# Patient Record
Sex: Male | Born: 1980 | Race: Black or African American | Hispanic: No | Marital: Married | Smoking: Former smoker
Health system: Southern US, Community
[De-identification: ages and names within clinical notes are randomized; demographics above are authoritative.]

## PROBLEM LIST (undated history)

## (undated) HISTORY — PX: BICEPS TENDON REPAIR: SHX566

## (undated) HISTORY — PX: KNEE SURGERY: SHX244

---

## 2015-01-07 DIAGNOSIS — S46191D Other injury of muscle, fascia and tendon of long head of biceps, right arm, subsequent encounter: Secondary | ICD-10-CM | POA: Insufficient documentation

## 2016-03-03 DIAGNOSIS — R739 Hyperglycemia, unspecified: Secondary | ICD-10-CM | POA: Insufficient documentation

## 2016-03-03 DIAGNOSIS — E66813 Obesity, class 3: Secondary | ICD-10-CM | POA: Insufficient documentation

## 2016-03-03 DIAGNOSIS — R61 Generalized hyperhidrosis: Secondary | ICD-10-CM | POA: Insufficient documentation

## 2016-03-03 DIAGNOSIS — N5089 Other specified disorders of the male genital organs: Secondary | ICD-10-CM | POA: Insufficient documentation

## 2018-06-18 ENCOUNTER — Encounter (HOSPITAL_COMMUNITY): Payer: Self-pay

## 2018-06-18 ENCOUNTER — Ambulatory Visit (HOSPITAL_COMMUNITY)
Admission: EM | Admit: 2018-06-18 | Discharge: 2018-06-18 | Disposition: A | Discharge status: Home / Self Care | Payer: Self-pay | Attending: Urgent Care | Admitting: Urgent Care

## 2018-06-18 ENCOUNTER — Other Ambulatory Visit: Payer: Self-pay

## 2018-06-18 DIAGNOSIS — R070 Pain in throat: Secondary | ICD-10-CM | POA: Insufficient documentation

## 2018-06-18 DIAGNOSIS — R05 Cough: Secondary | ICD-10-CM | POA: Insufficient documentation

## 2018-06-18 DIAGNOSIS — R69 Illness, unspecified: Secondary | ICD-10-CM | POA: Insufficient documentation

## 2018-06-18 DIAGNOSIS — R059 Cough, unspecified: Secondary | ICD-10-CM

## 2018-06-18 DIAGNOSIS — J111 Influenza due to unidentified influenza virus with other respiratory manifestations: Secondary | ICD-10-CM

## 2018-06-18 MED ORDER — OSELTAMIVIR PHOSPHATE 75 MG PO CAPS: 75.0000 mg | ORAL_CAPSULE | Freq: Two times a day (BID) | ORAL | 0 refills | Status: DC

## 2018-06-18 MED ORDER — BENZONATATE 100 MG PO CAPS: 100.0000 mg | ORAL_CAPSULE | Freq: Three times a day (TID) | ORAL | 0 refills | Status: DC | PRN

## 2018-06-18 NOTE — Discharge Instructions (Signed)
For sore throat try using a honey-based tea. Use 3 teaspoons of honey with juice squeezed from half lemon. Place shaved pieces of ginger into 1/2-1 cup of water and warm over stove top. Then mix the ingredients and repeat every 4 hours as needed. You may take 500mg Tylenol with ibuprofen 400-600mg every 6 hours for pain and inflammation.  °

## 2018-06-18 NOTE — ED Triage Notes (Signed)
Pt cc sore throat x 3 days body aches and chills  started today

## 2018-06-18 NOTE — ED Provider Notes (Addendum)
  MRN: [id number] DOB: 03/24/1981  Subjective:   Adam Rasmussen is a 37 y.o. male presenting for 3 day history of moderate-severe sharp constant throat pain, some difficulty swallowing. Has also had mild cough, started having body aches, chills today. He has a 729 month old son that also had similar symptoms a few days ago. He is not currently taking any medications and has no known food or drug allergies.  Denies past medical and surgical history. Patient is a former smoker.   Objective:   Vitals: BP 132/72 (BP Location: Right Arm)   Pulse 63   Temp 98.1 F (36.7 C)   Resp 18   Wt (!) 320 lb (145.2 kg)   SpO2 100%   Physical Exam Constitutional:      General: He is not in acute distress.    Appearance: He is well-developed. He is obese. He is not ill-appearing, toxic-appearing or diaphoretic.  HENT:     Head: Normocephalic and atraumatic.     Right Ear: Tympanic membrane and ear canal normal. No drainage, swelling or tenderness. No middle ear effusion. Tympanic membrane is not erythematous.     Left Ear: Tympanic membrane and ear canal normal. No drainage, swelling or tenderness.  No middle ear effusion. Tympanic membrane is not erythematous.     Nose: No congestion or rhinorrhea.     Mouth/Throat:     Mouth: Mucous membranes are moist.     Pharynx: Oropharynx is clear. No oropharyngeal exudate, posterior oropharyngeal erythema or uvula swelling.     Tonsils: No tonsillar exudate or tonsillar abscesses.  Eyes:     Extraocular Movements:     Right eye: Normal extraocular motion.     Left eye: Normal extraocular motion.     Conjunctiva/sclera: Conjunctivae normal.     Pupils: Pupils are equal, round, and reactive to light.  Neck:     Musculoskeletal: Normal range of motion and neck supple.  Cardiovascular:     Rate and Rhythm: Normal rate and regular rhythm.     Heart sounds: No murmur. No friction rub. No gallop.   Pulmonary:     Effort: Pulmonary effort is normal. No respiratory  distress.     Breath sounds: Normal breath sounds. No stridor. No wheezing, rhonchi or rales.  Chest:     Chest wall: No tenderness.  Lymphadenopathy:     Cervical: No cervical adenopathy.  Neurological:     Mental Status: He is alert and oriented to person, place, and time.  Psychiatric:        Mood and Affect: Mood normal.        Behavior: Behavior normal.     Assessment and Plan :   Influenza-like illness  Throat pain  Cough  Will manage empirically for influenza with Tamiflu, has reassuring physical exam findings. Advised supportive care, offered symptomatic relief. Return-to-clinic precautions discussed, patient verbalized understanding.       Wallis BambergMani, Kattie Santoyo, PA-C 06/18/18 [id number]    Wallis BambergMani, Krishawna Stiefel, PA-C 06/18/18 [phone]

## 2018-08-20 ENCOUNTER — Other Ambulatory Visit: Payer: Self-pay | Admitting: Nurse Practitioner

## 2018-08-20 DIAGNOSIS — S99911D Unspecified injury of right ankle, subsequent encounter: Secondary | ICD-10-CM

## 2018-08-20 DIAGNOSIS — M79661 Pain in right lower leg: Secondary | ICD-10-CM

## 2018-08-23 DIAGNOSIS — Z6841 Body Mass Index (BMI) 40.0 and over, adult: Secondary | ICD-10-CM | POA: Insufficient documentation

## 2018-08-31 ENCOUNTER — Ambulatory Visit
Admission: RE | Admit: 2018-08-31 | Discharge: 2018-08-31 | Disposition: A | Discharge status: Home / Self Care | Payer: Self-pay | Source: Ambulatory Visit | Attending: Nurse Practitioner | Admitting: Nurse Practitioner

## 2018-08-31 ENCOUNTER — Ambulatory Visit
Admission: RE | Admit: 2018-08-31 | Discharge: 2018-08-31 | Disposition: A | Discharge status: Home / Self Care | Payer: Worker's Compensation | Source: Ambulatory Visit | Attending: Nurse Practitioner | Admitting: Nurse Practitioner

## 2018-08-31 DIAGNOSIS — M79661 Pain in right lower leg: Secondary | ICD-10-CM

## 2018-08-31 DIAGNOSIS — S99911D Unspecified injury of right ankle, subsequent encounter: Secondary | ICD-10-CM

## 2019-01-04 ENCOUNTER — Other Ambulatory Visit: Payer: Self-pay

## 2019-01-04 DIAGNOSIS — Z20822 Contact with and (suspected) exposure to covid-19: Secondary | ICD-10-CM

## 2019-01-10 LAB — NOVEL CORONAVIRUS, NAA: SARS-CoV-2, NAA: NOT DETECTED

## 2019-08-03 IMAGING — US RIGHT LOWER EXTREMITY VENOUS ULTRASOUND
1 series · 13 of 24 positions shown · non-contrast
Comparison: None.

CLINICAL DATA: 37-year-old male with right lower extremity pain and
swelling. Recent ankle injury.



[Series 1: right lower extremity venous ultrasound · 0.10mm/px · 13 of 32 slices shown]
[im 1/32]
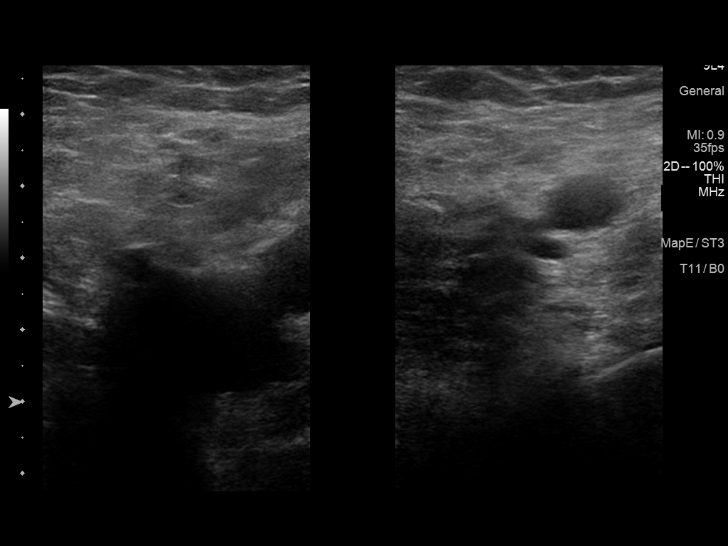
[im 3/32]
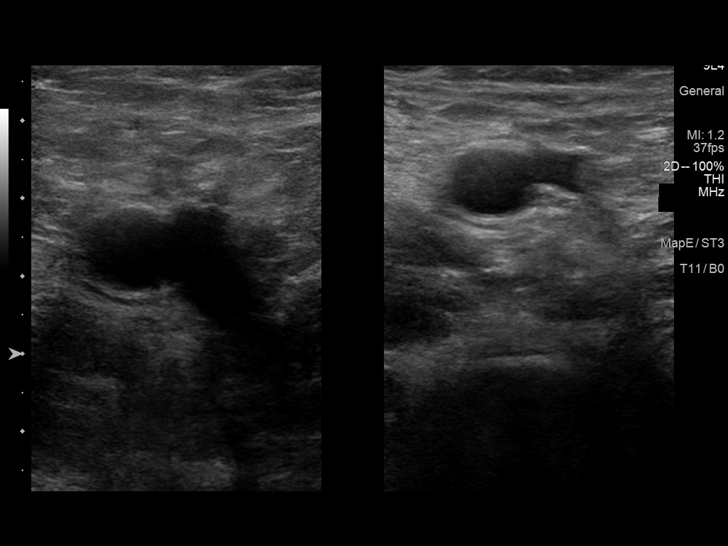
[im 6/32]
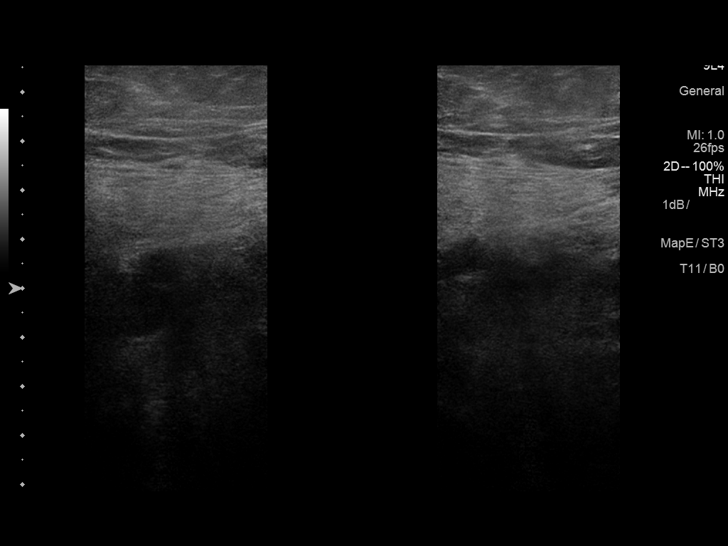
[im 9/32]
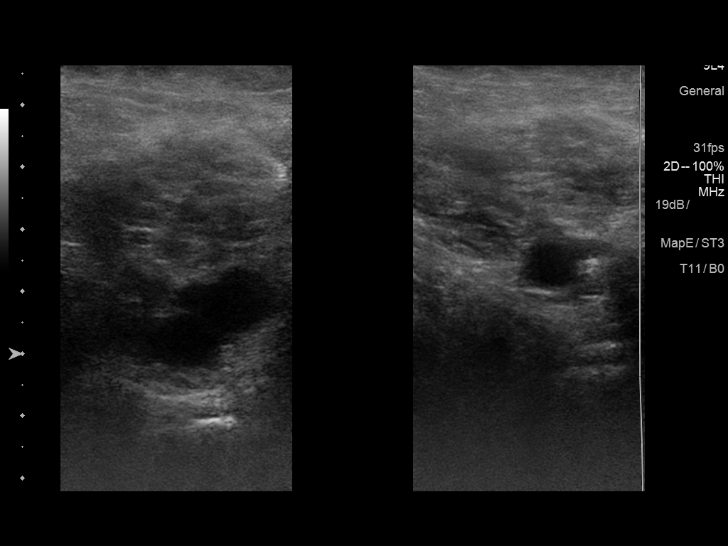
[im 11/32]
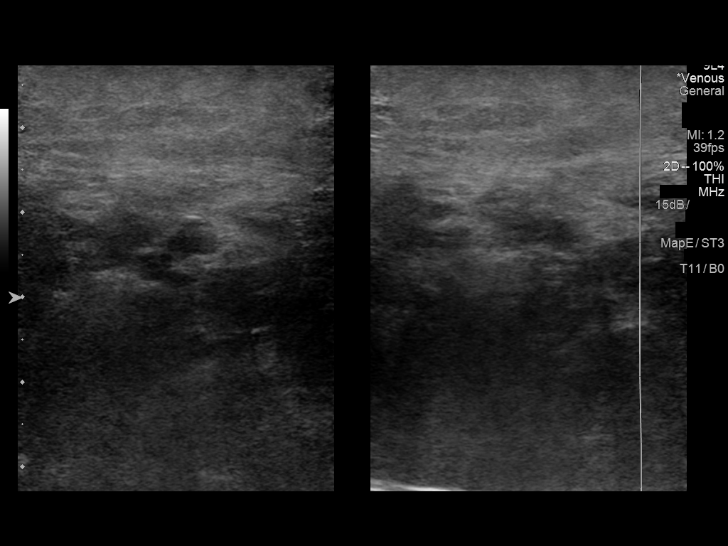
[im 14/32]
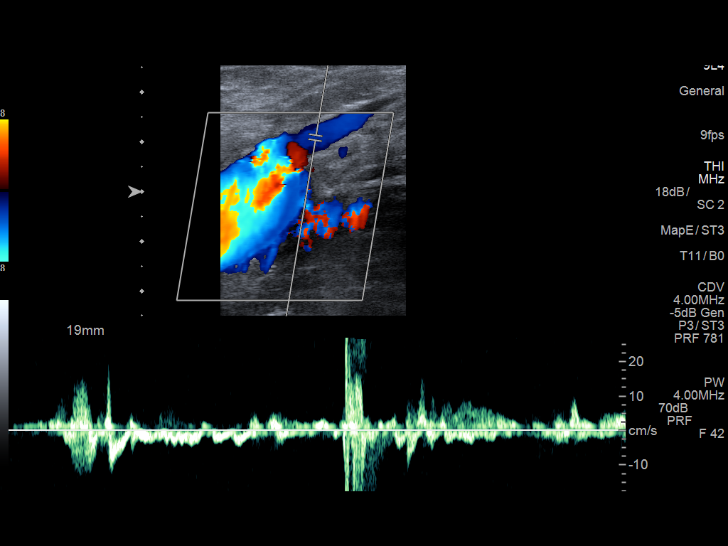
[im 17/32]
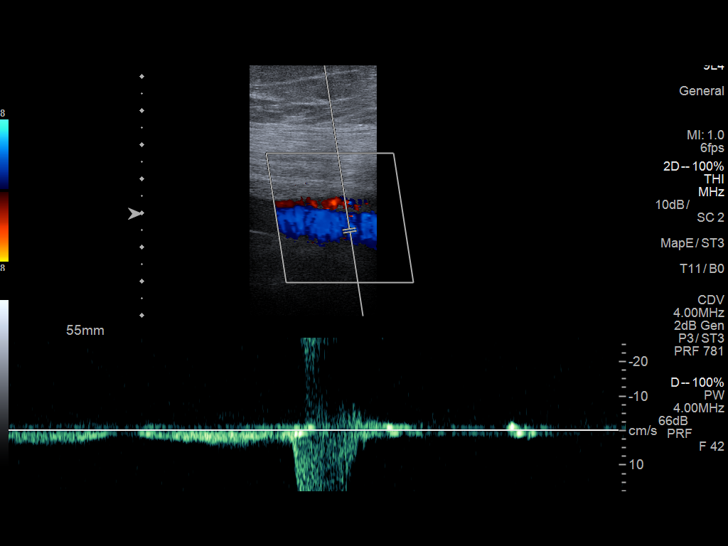
[im 18/32]
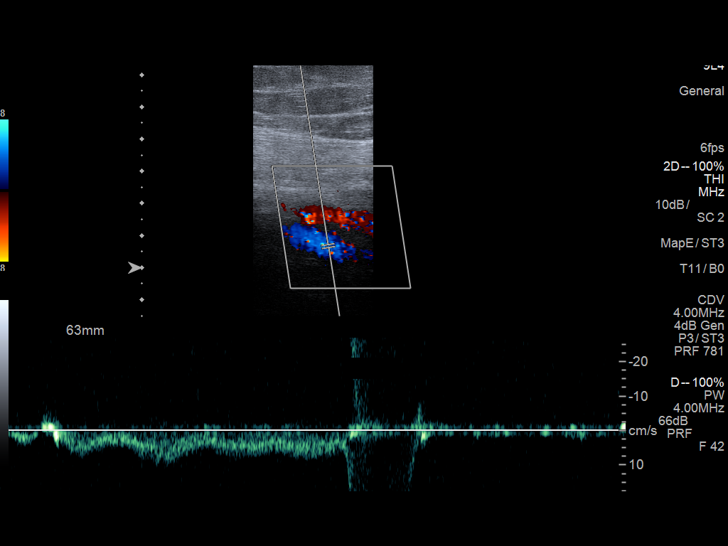
[im 21/32]
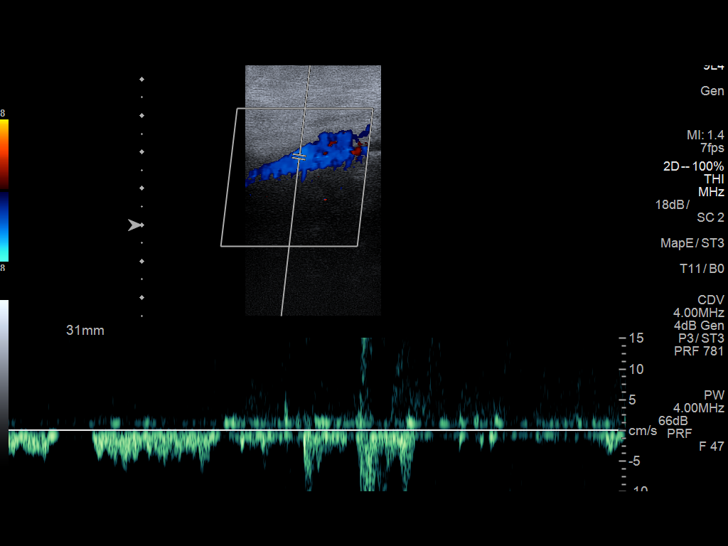
[im 23/32]
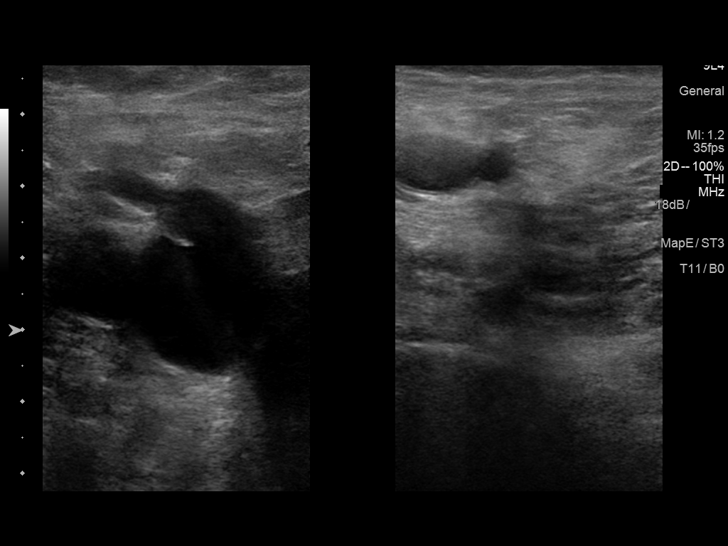
[im 26/32]
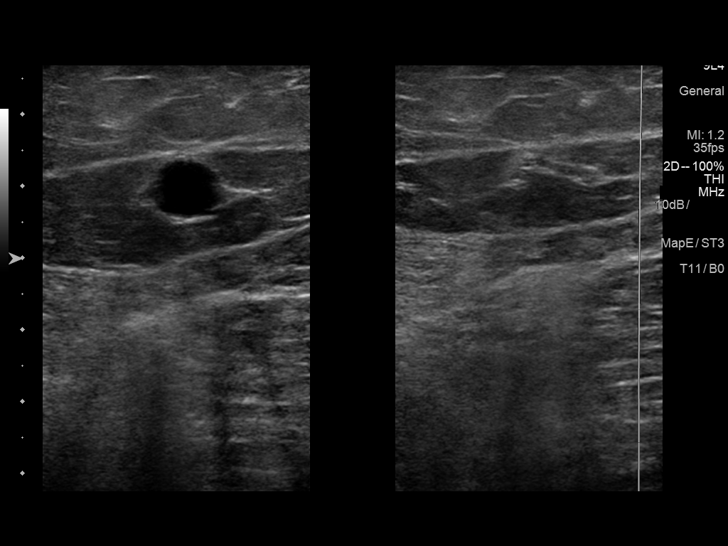
[im 29/32]
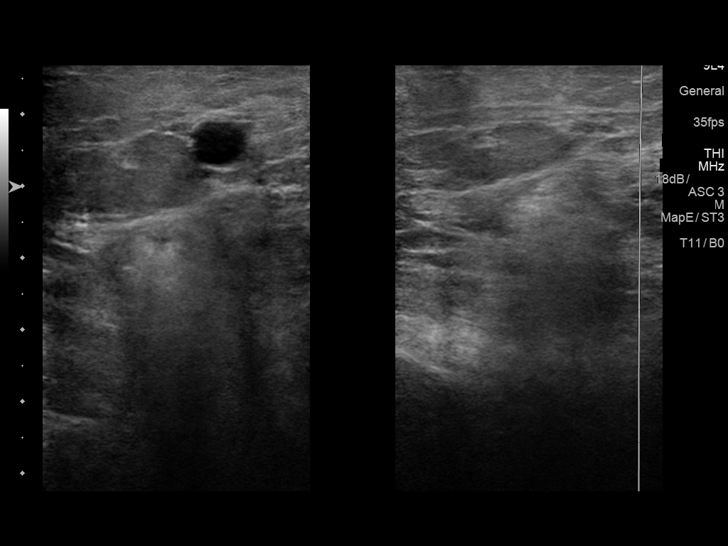
[im 32/32]
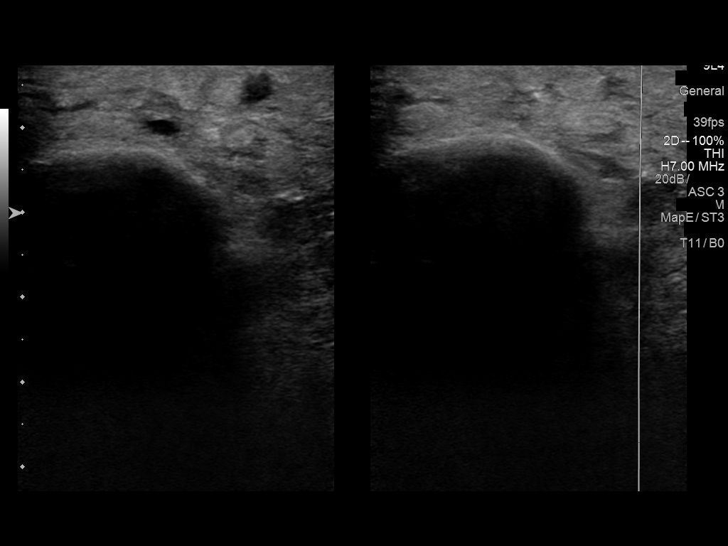

[13 of 24 positions shown; findings below may reference images not displayed]

FINDINGS: Contralateral Common Femoral Vein: Respiratory phasicity is normal
and symmetric with the symptomatic side. No evidence of thrombus.
Normal compressibility.

Common Femoral Vein: No evidence of thrombus. Normal
compressibility, respiratory phasicity and response to augmentation.

Saphenofemoral Junction: No evidence of thrombus. Normal
compressibility and flow on color Doppler imaging.

Profunda Femoral Vein: No evidence of thrombus. Normal
compressibility and flow on color Doppler imaging.

Femoral Vein: No evidence of thrombus. Normal compressibility,
respiratory phasicity and response to augmentation.

Popliteal Vein: No evidence of thrombus. Normal compressibility,
respiratory phasicity and response to augmentation.

Calf Veins: No evidence of thrombus. Normal compressibility and flow
on color Doppler imaging.

Superficial Great Saphenous Vein: No evidence of thrombus. Normal
compressibility.

Venous Reflux:  None.

Other Findings:  None.
IMPRESSION: No evidence of deep venous thrombosis.

## 2019-12-10 ENCOUNTER — Encounter: Payer: Self-pay | Admitting: Family Medicine

## 2019-12-10 ENCOUNTER — Other Ambulatory Visit: Payer: Self-pay

## 2019-12-10 ENCOUNTER — Ambulatory Visit: Payer: Self-pay | Admitting: Family Medicine

## 2019-12-10 VITALS — BP 115/74 | HR 70 | Ht 72.0 in | Wt 319.0 lb

## 2019-12-10 DIAGNOSIS — Z024 Encounter for examination for driving license: Secondary | ICD-10-CM | POA: Insufficient documentation

## 2019-12-10 LAB — POCT URINALYSIS DIPSTICK
Bilirubin, UA: NEGATIVE
Blood, UA: NEGATIVE
Glucose, UA: NEGATIVE
Ketones, UA: NEGATIVE
Leukocytes, UA: NEGATIVE
Nitrite, UA: NEGATIVE
Protein, UA: NEGATIVE
Spec Grav, UA: 1.015 (ref 1.010–1.025)
Urobilinogen, UA: 0.2 E.U./dL
pH, UA: 5 (ref 5.0–8.0)

## 2019-12-10 NOTE — Assessment & Plan Note (Signed)
DOT PE - certificate provided x 2 years

## 2019-12-10 NOTE — Patient Instructions (Signed)
DOT PE - certificate issued for 2 years

## 2019-12-10 NOTE — Progress Notes (Signed)
Subjective:    Patient ID: Adam Rasmussen, male    DOB: 1981-05-28, 39 y.o.   MRN: [id number]  Adam Rasmussen is a 39 y.o. 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  Social History   Tobacco Use  . Smoking status: Current Every Day Smoker    Packs/day: 0.50    Types: Cigarettes  . Smokeless tobacco: Never Used  Substance Use Topics  . Alcohol use: Yes    Comment: occasionally  . Drug use: Never    Review of Systems  Constitutional: Negative.   HENT: Negative.   Eyes: Negative.   Respiratory: Negative.   Cardiovascular: Negative.   Gastrointestinal: Negative.   Endocrine: Negative.   Genitourinary: Negative.   Musculoskeletal: Negative.   Skin: Negative.   Allergic/Immunologic: Negative.   Neurological: Negative.   Hematological: Negative.   Psychiatric/Behavioral: Negative.    Per HPI unless specifically indicated above     Objective:    BP 115/74 (BP Location: Left Arm, Patient Position: Sitting, Cuff Size: Large)   Pulse 70   Ht 6' (1.829 m)   Wt (!) 319 lb (144.7 kg)   BMI 43.26 kg/m   Wt Readings from Last 3 Encounters:  12/10/19 (!) 319 lb (144.7 kg)  06/18/18 (!) 320 lb (145.2 kg)    Physical Exam Vitals reviewed.  Constitutional:      General: He is not in acute distress.    Appearance: Normal appearance. He is well-developed and well-groomed. He is obese. He is not ill-appearing or toxic-appearing.  HENT:     Head: Normocephalic.     Right Ear: Tympanic membrane, ear canal and external ear normal. There is no impacted cerumen.     Left Ear: Tympanic membrane, ear canal and external ear normal. There is no impacted cerumen.     Nose: Nose normal. No congestion or rhinorrhea.     Mouth/Throat:     Mouth: Mucous membranes are moist.     Pharynx: Oropharynx is clear. No oropharyngeal exudate or posterior oropharyngeal erythema.  Eyes:     General: Lids are  normal. Vision grossly intact. No scleral icterus.       Right eye: No discharge.        Left eye: No discharge.     Extraocular Movements: Extraocular movements intact.     Conjunctiva/sclera: Conjunctivae normal.     Pupils: Pupils are equal, round, and reactive to light.  Cardiovascular:     Rate and Rhythm: Normal rate and regular rhythm.     Pulses: Normal pulses.          Dorsalis pedis pulses are 2+ on the right side and 2+ on the left side.     Heart sounds: Normal heart sounds. No murmur heard.  No friction rub. No gallop.   Pulmonary:     Effort: Pulmonary effort is normal. No respiratory distress.     Breath sounds: Normal breath sounds. No wheezing, rhonchi or rales.  Abdominal:     General: Abdomen is flat. Bowel sounds are normal. There is no distension.     Palpations: Abdomen is soft. There is no hepatomegaly, splenomegaly or mass.     Tenderness: There is no abdominal tenderness. There is no right CVA tenderness, left CVA tenderness, guarding or rebound.     Hernia: No hernia is present.  Musculoskeletal:        General: Normal range of  motion.     Cervical back: Normal range of motion and neck supple. No rigidity or tenderness.     Right lower leg: No edema.     Left lower leg: No edema.     Comments: Normal tone 5/5 strength BUE & BLE  Feet:     Right foot:     Skin integrity: Skin integrity normal.     Left foot:     Skin integrity: Skin integrity normal.  Lymphadenopathy:     Cervical: No cervical adenopathy.  Skin:    General: Skin is warm and dry.     Capillary Refill: Capillary refill takes less than 2 seconds.  Neurological:     General: No focal deficit present.     Mental Status: He is alert and oriented to person, place, and time.     Cranial Nerves: Cranial nerves are intact. No cranial nerve deficit.     Sensory: Sensation is intact. No sensory deficit.     Motor: Motor function is intact. No weakness.     Coordination: Coordination is intact.  Coordination normal.     Gait: Gait is intact. Gait normal.     Deep Tendon Reflexes: Reflexes are normal and symmetric. Reflexes normal.  Psychiatric:        Attention and Perception: Attention and perception normal.        Mood and Affect: Mood and affect normal.        Speech: Speech normal.        Behavior: Behavior normal. Behavior is cooperative.        Thought Content: Thought content normal.        Cognition and Memory: Cognition and memory normal.        Judgment: Judgment normal.    Results for orders placed or performed in visit on 12/10/19  POCT Urinalysis Dipstick  Result Value Ref Range   Color, UA Yellow    Clarity, UA clear    Glucose, UA Negative Negative   Bilirubin, UA negative    Ketones, UA negative    Spec Grav, UA 1.015 1.010 - 1.025   Blood, UA negative    pH, UA 5.0 5.0 - 8.0   Protein, UA Negative Negative   Urobilinogen, UA 0.2 0.2 or 1.0 E.U./dL   Nitrite, UA negative    Leukocytes, UA Negative Negative   Appearance     Odor        Assessment & Plan:   Problem List Items Addressed This Visit      Other   Encounter for commercial driving license (CDL) exam    DOT PE - certificate provided x 2 years       Other Visit Diagnoses    Encounter for CDL (commercial driving license) exam    -  Primary   Relevant Orders   POCT Urinalysis Dipstick (Completed)      No orders of the defined types were placed in this encounter.     Follow up plan: Return in about 2 years (around 12/09/2021) for DOT PE.   Charlaine Dalton, FNP Family Nurse Practitioner [facility] 12/10/2019, 11:45 AM

## 2020-07-29 DIAGNOSIS — R7303 Prediabetes: Secondary | ICD-10-CM | POA: Insufficient documentation

## 2021-03-10 DIAGNOSIS — M545 Low back pain, unspecified: Secondary | ICD-10-CM | POA: Diagnosis not present

## 2021-08-06 DIAGNOSIS — L814 Other melanin hyperpigmentation: Secondary | ICD-10-CM | POA: Diagnosis not present

## 2021-08-06 DIAGNOSIS — M79604 Pain in right leg: Secondary | ICD-10-CM | POA: Diagnosis not present

## 2021-08-06 DIAGNOSIS — Z8042 Family history of malignant neoplasm of prostate: Secondary | ICD-10-CM | POA: Diagnosis not present

## 2021-08-06 DIAGNOSIS — R7303 Prediabetes: Secondary | ICD-10-CM | POA: Diagnosis not present

## 2021-08-06 DIAGNOSIS — Z720 Tobacco use: Secondary | ICD-10-CM | POA: Diagnosis not present

## 2021-08-06 DIAGNOSIS — Z0000 Encounter for general adult medical examination without abnormal findings: Secondary | ICD-10-CM | POA: Diagnosis not present

## 2021-10-21 DIAGNOSIS — Z720 Tobacco use: Secondary | ICD-10-CM | POA: Diagnosis not present

## 2021-10-21 DIAGNOSIS — F331 Major depressive disorder, recurrent, moderate: Secondary | ICD-10-CM | POA: Diagnosis not present

## 2021-10-21 DIAGNOSIS — G4733 Obstructive sleep apnea (adult) (pediatric): Secondary | ICD-10-CM | POA: Diagnosis not present

## 2021-10-21 DIAGNOSIS — Z6841 Body Mass Index (BMI) 40.0 and over, adult: Secondary | ICD-10-CM | POA: Diagnosis not present

## 2021-10-21 DIAGNOSIS — R0982 Postnasal drip: Secondary | ICD-10-CM | POA: Diagnosis not present

## 2021-10-21 DIAGNOSIS — R1030 Lower abdominal pain, unspecified: Secondary | ICD-10-CM | POA: Diagnosis not present

## 2021-10-21 DIAGNOSIS — R369 Urethral discharge, unspecified: Secondary | ICD-10-CM | POA: Diagnosis not present

## 2021-10-30 DIAGNOSIS — R1030 Lower abdominal pain, unspecified: Secondary | ICD-10-CM | POA: Diagnosis not present

## 2021-10-30 DIAGNOSIS — R369 Urethral discharge, unspecified: Secondary | ICD-10-CM | POA: Diagnosis not present

## 2021-11-12 ENCOUNTER — Ambulatory Visit (INDEPENDENT_AMBULATORY_CARE_PROVIDER_SITE_OTHER): Payer: BC Managed Care – PPO | Admitting: Adult Health

## 2021-11-12 ENCOUNTER — Encounter: Payer: Self-pay | Admitting: Adult Health

## 2021-11-12 VITALS — BP 126/80 | HR 72 | Temp 98.0°F | Ht 72.0 in | Wt 336.8 lb

## 2021-11-12 DIAGNOSIS — Z024 Encounter for examination for driving license: Secondary | ICD-10-CM | POA: Diagnosis not present

## 2021-11-12 DIAGNOSIS — G4733 Obstructive sleep apnea (adult) (pediatric): Secondary | ICD-10-CM | POA: Diagnosis not present

## 2021-11-12 NOTE — Assessment & Plan Note (Signed)
History of obstructive sleep apnea, morbid obesity with BMI at 45, snoring and restless sleep all suspicious that patient has ongoing sleep apnea.  We will set patient up for home sleep study for further evaluation  - discussed how weight can impact sleep and risk for sleep disordered breathing - discussed options to assist with weight loss: combination of diet modification, cardiovascular and strength training exercises   - had an extensive discussion regarding the adverse health consequences related to untreated sleep disordered breathing - specifically discussed the risks for hypertension, coronary artery disease, cardiac dysrhythmias, cerebrovascular disease, and diabetes - lifestyle modification discussed   - discussed how sleep disruption can increase risk of accidents, particularly when driving - safe driving practices were discussed   Plan  Patient Instructions  Set up for home sleep study Work on healthy weight loss Do not drive if sleepy Follow-up in 6 to 8 weeks to review results and treatment plan

## 2021-11-12 NOTE — Patient Instructions (Signed)
Set up for home sleep study Work on healthy weight loss Do not drive if sleepy Follow-up in 6 to 8 weeks to review results and treatment plan

## 2021-11-12 NOTE — Progress Notes (Signed)
@Patient  ID: Adam Rasmussen, male    DOB: 1981/02/19, 41 y.o.   MRN: [id number]  Chief Complaint  Patient presents with   Consult    Referring provider: Elinor Parkinson  HPI: 41 year old male seen for sleep consult Nov 12, 2021 for sleep apnea.  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  TEST/EVENTS :   11/12/2021 Sleep consult  Patient presents for sleep consult today.  Patient says he was diagnosed with sleep apnea in 2017.  He underwent a sleep study that showed sleep apnea.  He is unsure how severe it was.  He was recommended to either do weight loss or CPAP.  Patient says he never started CPAP tried to lose some weight but has been unsuccessful.  Current weight is at 336 pounds with a BMI of 45.  Patient says he recently had a DOT physical.  He was referred over to for evaluation of sleep apnea.  Patient says he does feel sleepy during the daytime.  Have some restless sleep and intermittent snoring.  Typically goes to bed about 8:30 PM.  Takes very short time to go to sleep.  Is only up once or twice at nighttime.  Gets up at 4:30 AM.  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   Epworth score is 13 out of 24.  Gets sleepy if he is inactive or sitting still or in the afternoon hours. Caffeine intake 4 sodas daily . Caffeine gummies on occasion.  No symptoms suspicious for sleep paralysis or cataplexy No history of stroke or congestive heart failure  Medical history is unremarkable  Surgical history none  Social history patient is Married , 2 kids .  Smokes half a pack of cigarettes daily, no alcohol  Family history- OSA   No Known Allergies  Immunization History  Administered Date(s) Administered   Moderna Sars-Covid-2 Vaccination 04/25/2020, 05/23/2020   Tdap 12/13/2012    No past medical history on file.  Tobacco History: Social History   Tobacco Use  Smoking Status Every Day   Packs/day: 0.50   Types: Cigarettes   Smokeless Tobacco Never  Tobacco Comments   Has smoked since he was 41 years old.   Ready to quit: Not Answered Counseling given: Not Answered Tobacco comments: Has smoked since he was 41 years old.   Outpatient Medications Prior to Visit  Medication Sig Dispense Refill   buPROPion (WELLBUTRIN XL) 150 MG 24 hr tablet Take 300 mg by mouth every morning. (Patient not taking: Reported on 11/12/2021)     cetirizine (ZYRTEC) 10 MG tablet Take 10 mg by mouth daily. (Patient not taking: Reported on 11/12/2021)     No facility-administered medications prior to visit.     Review of Systems:   Constitutional:   No  weight loss, night sweats,  Fevers, chills, fatigue, or  lassitude.  HEENT:   No headaches,  Difficulty swallowing,  Tooth/dental problems, or  Sore throat,                No sneezing, itching, ear ache, nasal congestion, post nasal drip,   CV:  No chest pain,  Orthopnea, PND, swelling in lower extremities, anasarca, dizziness, palpitations, syncope.   GI  No heartburn, indigestion, abdominal pain, nausea, vomiting, diarrhea, change in bowel habits, loss of appetite, bloody stools.   Resp: No shortness of breath with exertion or at rest.  No excess mucus, no productive cough,  No non-productive cough,  No coughing up  of blood.  No change in color of mucus.  No wheezing.  No chest wall deformity  Skin: no rash or lesions.  GU: no dysuria, change in color of urine, no urgency or frequency.  No flank pain, no hematuria   MS:  No joint pain or swelling.  No decreased range of motion.  No back pain.    Physical Exam  BP 126/80 (BP Location: Left Arm, Patient Position: Sitting, Cuff Size: Large)   Pulse 72   Temp 98 F (36.7 C) (Oral)   Ht 6' (1.829 m)   Wt (!) 336 lb 12.8 oz (152.8 kg)   SpO2 99%   BMI 45.68 kg/m   GEN: A/Ox3; pleasant , NAD, well nourished    HEENT:  [name],  EACs-clear, TMs-wnl, NOSE-clear, THROAT-clear, no lesions, no postnasal drip or exudate  noted.  Class II-III MP airway  NECK:  Supple w/ fair ROM; no JVD; normal carotid impulses w/o bruits; no thyromegaly or nodules palpated; no lymphadenopathy.    RESP  Clear  P & A; w/o, wheezes/ rales/ or rhonchi. no accessory muscle use, no dullness to percussion  CARD:  RRR, no m/r/g, no peripheral edema, pulses intact, no cyanosis or clubbing.  GI:   Soft & nt; nml bowel sounds; no organomegaly or masses detected.   Musco: Warm bil, no deformities or joint swelling noted.   Neuro: alert, no focal deficits noted.    Skin: Warm, no lesions or rashes    Lab Results:  CBC No results found for: WBC, RBC, HGB, HCT, PLT, MCV, MCH, MCHC, RDW, LYMPHSABS, MONOABS, EOSABS, BASOSABS  BMET No results found for: NA, K, CL, CO2, GLUCOSE, BUN, CREATININE, CALCIUM, GFRNONAA, GFRAA  BNP No results found for: BNP  ProBNP No results found for: PROBNP  Imaging: No results found.        View : No data to display.          No results found for: NITRICOXIDE      Assessment & Plan:   OSA (obstructive sleep apnea) History of obstructive sleep apnea, morbid obesity with BMI at 45, snoring and restless sleep all suspicious that patient has ongoing sleep apnea.  We will set patient up for home sleep study for further evaluation  - discussed how weight can impact sleep and risk for sleep disordered breathing - discussed options to assist with weight loss: combination of diet modification, cardiovascular and strength training exercises   - had an extensive discussion regarding the adverse health consequences related to untreated sleep disordered breathing - specifically discussed the risks for hypertension, coronary artery disease, cardiac dysrhythmias, cerebrovascular disease, and diabetes - lifestyle modification discussed   - discussed how sleep disruption can increase risk of accidents, particularly when driving - safe driving practices were discussed   Plan  Patient  Instructions  Set up for home sleep study Work on healthy weight loss Do not drive if sleepy Follow-up in 6 to 8 weeks to review results and treatment plan     Morbid obesity ([place]) Healthy weight loss discussed     Rexene Edison, NP 11/12/2021

## 2021-11-12 NOTE — Assessment & Plan Note (Signed)
Healthy weight loss discussed 

## 2021-11-19 DIAGNOSIS — F331 Major depressive disorder, recurrent, moderate: Secondary | ICD-10-CM | POA: Diagnosis not present

## 2021-11-19 DIAGNOSIS — R369 Urethral discharge, unspecified: Secondary | ICD-10-CM | POA: Diagnosis not present

## 2021-11-19 DIAGNOSIS — R300 Dysuria: Secondary | ICD-10-CM | POA: Diagnosis not present

## 2021-11-19 DIAGNOSIS — Z720 Tobacco use: Secondary | ICD-10-CM | POA: Diagnosis not present

## 2021-11-22 DIAGNOSIS — R369 Urethral discharge, unspecified: Secondary | ICD-10-CM | POA: Diagnosis not present

## 2021-11-22 DIAGNOSIS — R300 Dysuria: Secondary | ICD-10-CM | POA: Diagnosis not present

## 2021-11-24 DIAGNOSIS — R3914 Feeling of incomplete bladder emptying: Secondary | ICD-10-CM | POA: Diagnosis not present

## 2021-11-24 DIAGNOSIS — Z8619 Personal history of other infectious and parasitic diseases: Secondary | ICD-10-CM | POA: Diagnosis not present

## 2021-11-24 DIAGNOSIS — R300 Dysuria: Secondary | ICD-10-CM | POA: Diagnosis not present

## 2021-11-24 DIAGNOSIS — N342 Other urethritis: Secondary | ICD-10-CM | POA: Diagnosis not present

## 2021-12-17 DIAGNOSIS — N341 Nonspecific urethritis: Secondary | ICD-10-CM | POA: Diagnosis not present

## 2021-12-17 DIAGNOSIS — F331 Major depressive disorder, recurrent, moderate: Secondary | ICD-10-CM | POA: Diagnosis not present

## 2021-12-17 DIAGNOSIS — R7303 Prediabetes: Secondary | ICD-10-CM | POA: Diagnosis not present

## 2022-01-19 ENCOUNTER — Ambulatory Visit: Payer: BC Managed Care – PPO

## 2022-01-19 DIAGNOSIS — G4733 Obstructive sleep apnea (adult) (pediatric): Secondary | ICD-10-CM

## 2022-01-19 DIAGNOSIS — Z024 Encounter for examination for driving license: Secondary | ICD-10-CM

## 2022-01-26 DIAGNOSIS — G4733 Obstructive sleep apnea (adult) (pediatric): Secondary | ICD-10-CM | POA: Diagnosis not present

## 2022-02-04 ENCOUNTER — Telehealth: Payer: Self-pay | Admitting: Adult Health

## 2022-02-04 NOTE — Telephone Encounter (Signed)
Called and spoke with patient, provided results/recommendations per Rubye Oaks NP.  He verbalized understanding.  Scheduled OV for 02/15/22 at 10:30 am, advised to arrive by 10:15 am for check in.  Nothing further needed.

## 2022-02-04 NOTE — Telephone Encounter (Signed)
HST 01/19/22 showed moderate OSA with AHI 19.8/hr and SpO2 at 74%.  Please set up in person or virtual office visit to discuss results and treatment plan

## 2022-02-04 NOTE — Telephone Encounter (Signed)
-----   Message from Courtney Paris sent at 01/27/2022  2:53 PM EDT ----- Hello  this patient's HST report is ready for review

## 2022-02-15 ENCOUNTER — Ambulatory Visit (INDEPENDENT_AMBULATORY_CARE_PROVIDER_SITE_OTHER): Payer: BC Managed Care – PPO | Admitting: Adult Health

## 2022-02-15 ENCOUNTER — Encounter: Payer: Self-pay | Admitting: Adult Health

## 2022-02-15 DIAGNOSIS — G4733 Obstructive sleep apnea (adult) (pediatric): Secondary | ICD-10-CM

## 2022-02-15 NOTE — Assessment & Plan Note (Signed)
Healthy weight loss discussed 

## 2022-02-15 NOTE — Addendum Note (Signed)
Addended by: Delrae Rend on: 02/15/2022 04:23 PM   Modules accepted: Orders

## 2022-02-15 NOTE — Patient Instructions (Signed)
Begin CPAP At bedtime, wear all night long , for at least 6hr each night  Saline nasal spray Twice daily   Saline nasal gel At bedtime   Work on healthy weight loss Do not drive if sleepy Follow-up in 3 months and As needed

## 2022-02-15 NOTE — Assessment & Plan Note (Signed)
Mild obstructive sleep apnea.  Patient education given on sleep apnea and treatment options.  Patient will proceed with a CPAP at bedtime.  Patient education was given.  We will begin auto CPAP 5 to 15 cm H2O.  Mask will be a DreamWear nasal mask.  - discussed how weight can impact sleep and risk for sleep disordered breathing - discussed options to assist with weight loss: combination of diet modification, cardiovascular and strength training exercises   - had an extensive discussion regarding the adverse health consequences related to untreated sleep disordered breathing - specifically discussed the risks for hypertension, coronary artery disease, cardiac dysrhythmias, cerebrovascular disease, and diabetes - lifestyle modification discussed   - discussed how sleep disruption can increase risk of accidents, particularly when driving - safe driving practices were discussed   Plan  Patient Instructions  Begin CPAP At bedtime, wear all night long , for at least 6hr each night  Saline nasal spray Twice daily   Saline nasal gel At bedtime   Work on healthy weight loss Do not drive if sleepy Follow-up in 3 months and As needed

## 2022-02-15 NOTE — Progress Notes (Signed)
@Patient  ID: , male    DOB: Jan 30, 1981, 41 y.o.   MRN: [id number]  Chief Complaint  Patient presents with   Follow-up    Referring provider: No ref. provider found  HPI: 41 year old male seen for sleep consult Nov 12, 2021 to establish for sleep apnea. 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  TEST/EVENTS :   HST 01/19/2022 19.8 /hr , SpO2 at 74%.   02/15/2022 Follow up : OSA Patient presents for a 42-month follow-up.  Patient was seen last visit for a sleep consult to establish for sleep apnea.  Patient says he was diagnosed with sleep apnea in 2017.  Patient says he was recommended for CPAP but wanted to try weight loss first.  Patient says he was unsuccessful losing weight.  Continued to have ongoing symptoms with restless sleep, daytime sleepiness and snoring.  Patient also drives a truck for work that requires a DOT physical with CDL license .  Last visit patient was sent for a home sleep study.  This was completed on January 19, 2022 that showed moderate sleep apnea with AHI at 19.8/hour and SPO2 low at 74%.  We discussed his sleep study results went over treatment options including weight loss, oral appliance and CPAP.  Patient will proceed with the CPAP.  Patient does have facial hair.  We discussed different mask options.  He would like to try the DreamWear nasal mask.   No Known Allergies  Immunization History  Administered Date(s) Administered   Moderna Sars-Covid-2 Vaccination 04/25/2020, 05/23/2020   Tdap 12/13/2012    History reviewed. No pertinent past medical history.  Tobacco History: Social History   Tobacco Use  Smoking Status Every Day   Packs/day: 0.50   Types: Cigarettes  Smokeless Tobacco Never  Tobacco Comments   Has smoked since he was 41 years old.   Ready to quit: No Counseling given: Yes Tobacco comments: Has smoked since he was 41 years old.   Outpatient Medications Prior to Visit  Medication Sig Dispense  Refill   buPROPion (WELLBUTRIN XL) 150 MG 24 hr tablet Take 300 mg by mouth every morning. (Patient not taking: Reported on 11/12/2021)     cetirizine (ZYRTEC) 10 MG tablet Take 10 mg by mouth daily. (Patient not taking: Reported on 11/12/2021)     No facility-administered medications prior to visit.     Review of Systems:   Constitutional:   No  weight loss, night sweats,  Fevers, chills,  +fatigue, or  lassitude.  HEENT:   No headaches,  Difficulty swallowing,  Tooth/dental problems, or  Sore throat,                No sneezing, itching, ear ache, nasal congestion, post nasal drip,   CV:  No chest pain,  Orthopnea, PND, swelling in lower extremities, anasarca, dizziness, palpitations, syncope.   GI  No heartburn, indigestion, abdominal pain, nausea, vomiting, diarrhea, change in bowel habits, loss of appetite, bloody stools.   Resp: .  No chest wall deformity  Skin: no rash or lesions.  GU: no dysuria, change in color of urine, no urgency or frequency.  No flank pain, no hematuria   MS:  No joint pain or swelling.  No decreased range of motion.  No back pain.    Physical Exam  BP 138/70 (BP Location: Left Arm, Cuff Size: Large)   Pulse 79   Temp 98 F (36.7 C) (Oral)   Ht 6' (1.829 m)  Wt (!) 355 lb 9.6 oz (161.3 kg)   SpO2 97%   BMI 48.23 kg/m   GEN: A/Ox3; pleasant , NAD, well nourished    HEENT:  [place]/AT,  NOSE-clear, THROAT-clear, no lesions, no postnasal drip or exudate noted.  Class 2-3 MP airway   NECK:  Supple w/ fair ROM; no JVD; normal carotid impulses w/o bruits; no thyromegaly or nodules palpated; no lymphadenopathy.    RESP  Clear  P & A; w/o, wheezes/ rales/ or rhonchi. no accessory muscle use, no dullness to percussion  CARD:  RRR, no m/r/g, no peripheral edema, pulses intact, no cyanosis or clubbing.  GI:   Soft & nt; nml bowel sounds; no organomegaly or masses detected.   Musco: Warm bil, no deformities or joint swelling noted.   Neuro: alert,  no focal deficits noted.    Skin: Warm, no lesions or rashes    Lab Results:  CBC No results found for: "WBC", "RBC", "HGB", "HCT", "PLT", "MCV", "MCH", "MCHC", "RDW", "LYMPHSABS", "MONOABS", "EOSABS", "BASOSABS"  BMET No results found for: "NA", "K", "CL", "CO2", "GLUCOSE", "BUN", "CREATININE", "CALCIUM", "GFRNONAA", "GFRAA"  BNP No results found for: "BNP"  ProBNP No results found for: "PROBNP"  Imaging: No results found.        No data to display          No results found for: "NITRICOXIDE"      Assessment & Plan:   OSA (obstructive sleep apnea) Mild obstructive sleep apnea.  Patient education given on sleep apnea and treatment options.  Patient will proceed with a CPAP at bedtime.  Patient education was given.  We will begin auto CPAP 5 to 15 cm H2O.  Mask will be a DreamWear nasal mask.  - discussed how weight can impact sleep and risk for sleep disordered breathing - discussed options to assist with weight loss: combination of diet modification, cardiovascular and strength training exercises   - had an extensive discussion regarding the adverse health consequences related to untreated sleep disordered breathing - specifically discussed the risks for hypertension, coronary artery disease, cardiac dysrhythmias, cerebrovascular disease, and diabetes - lifestyle modification discussed   - discussed how sleep disruption can increase risk of accidents, particularly when driving - safe driving practices were discussed   Plan  Patient Instructions  Begin CPAP At bedtime, wear all night long , for at least 6hr each night  Saline nasal spray Twice daily   Saline nasal gel At bedtime   Work on healthy weight loss Do not drive if sleepy Follow-up in 3 months and As needed        Morbid obesity (HCC) Healthy weight loss discussed     Rubye Oaks, NP 02/15/2022

## 2022-03-28 ENCOUNTER — Ambulatory Visit
Admission: EM | Admit: 2022-03-28 | Discharge: 2022-03-28 | Disposition: A | Discharge status: Home / Self Care | Payer: BC Managed Care – PPO | Attending: Physician Assistant | Admitting: Physician Assistant

## 2022-03-28 DIAGNOSIS — J069 Acute upper respiratory infection, unspecified: Secondary | ICD-10-CM | POA: Diagnosis not present

## 2022-03-28 DIAGNOSIS — Z1152 Encounter for screening for COVID-19: Secondary | ICD-10-CM | POA: Insufficient documentation

## 2022-03-28 MED ORDER — BENZONATATE 100 MG PO CAPS: 100.0000 mg | ORAL_CAPSULE | Freq: Three times a day (TID) | ORAL | 0 refills | Status: DC

## 2022-03-28 NOTE — ED Triage Notes (Signed)
Pt c/o cough keeping him up at night,   Onset ~ Friday

## 2022-03-28 NOTE — ED Provider Notes (Signed)
[facility]    CSN: [id number] Arrival date & time: 03/28/22  1902      History   Chief Complaint Chief Complaint  Patient presents with   Cough    HPI Adam Rasmussen is a 41 y.o. male.   Patient here today for evaluation of chest congestion and cough that started a few days ago.  He reports that he does have a worsening cough at night and feels as if he is going to "suffocate".  He has not had fever but does state he does not feel "well".  He has had some body aches.  He denies any significant sore throat.  He has not had any nausea, vomiting or diarrhea.  He has tried over-the-counter medication for cold and cough without significant relief.  The history is provided by the patient.  Cough Associated symptoms: no chills, no ear pain, no eye discharge, no fever, no shortness of breath and no sore throat     History reviewed. No pertinent past medical history.  Patient Active Problem List   Diagnosis Date Noted   OSA (obstructive sleep apnea) 11/12/2021   Morbid obesity ([place]) 11/12/2021   Encounter for commercial driving license (CDL) exam 40/98/1191    History reviewed. No pertinent surgical history.     Home Medications    Prior to Admission medications   Medication Sig Start Date End Date Taking? Authorizing Provider  benzonatate (TESSALON) 100 MG capsule Take 1 capsule (100 mg total) by mouth every 8 (eight) hours. 03/28/22  Yes Francene Finders, PA-C  buPROPion (WELLBUTRIN XL) 150 MG 24 hr tablet Take 300 mg by mouth every morning. Patient not taking: Reported on 11/12/2021 10/21/21   [provider]  cetirizine (ZYRTEC) 10 MG tablet Take 10 mg by mouth daily. Patient not taking: Reported on 11/12/2021 10/21/21   [provider]    Family History History reviewed. No pertinent family history.  Social History Social History   Tobacco Use   Smoking status: Every Day    Packs/day: 0.50    Types: Cigarettes   Smokeless tobacco: Never    Tobacco comments:    Has smoked since he was 41 years old.  Substance Use Topics   Alcohol use: Yes    Comment: occasionally   Drug use: Never     Allergies   Patient has no known allergies.   Review of Systems Review of Systems  Constitutional:  Negative for chills and fever.  HENT:  Positive for congestion. Negative for ear pain and sore throat.   Eyes:  Negative for discharge and redness.  Respiratory:  Positive for cough. Negative for shortness of breath.   Gastrointestinal:  Negative for abdominal pain, nausea and vomiting.     Physical Exam Triage Vital Signs ED Triage Vitals [03/28/22 1920]  Enc Vitals Group     BP (!) 162/97     Pulse Rate 71     Resp 16     Temp 98 F (36.7 C)     Temp Source Oral     SpO2 95 %     Weight      Height      Head Circumference      Peak Flow      Pain Score 5     Pain Loc      Pain Edu?      Excl. in [place]?    No data found.  Updated Vital Signs BP (!) 162/97 (BP Location: Left Arm)  Pulse 71   Temp 98 F (36.7 C) (Oral)   Resp 16   SpO2 95%      Physical Exam Vitals and nursing note reviewed.  Constitutional:      General: He is not in acute distress.    Appearance: Normal appearance. He is not ill-appearing.  HENT:     Head: Normocephalic and atraumatic.     Nose: Congestion present.     Mouth/Throat:     Mouth: Mucous membranes are moist.     Pharynx: Oropharynx is clear. No oropharyngeal exudate or posterior oropharyngeal erythema.  Eyes:     Conjunctiva/sclera: Conjunctivae normal.  Cardiovascular:     Rate and Rhythm: Normal rate and regular rhythm.     Heart sounds: Normal heart sounds. No murmur heard. Pulmonary:     Effort: Pulmonary effort is normal. No respiratory distress.     Breath sounds: Normal breath sounds. No wheezing, rhonchi or rales.  Skin:    General: Skin is warm and dry.  Neurological:     Mental Status: He is alert.  Psychiatric:        Mood and Affect: Mood normal.         Thought Content: Thought content normal.      UC Treatments / Results  Labs (all labs ordered are listed, but only abnormal results are displayed) Labs Reviewed  RESP PANEL BY RT-PCR (FLU A&B, COVID) [id number]    EKG   Radiology No results found.  Procedures Procedures (including critical care time)  Medications Ordered in UC Medications - No data to display  Initial Impression / Assessment and Plan / UC Course  I have reviewed the triage vital signs and the nursing notes.  Pertinent labs & imaging results that were available during my care of the patient were reviewed by me and considered in my medical decision making (see chart for details).    Suspect viral etiology of symptoms.  Will prescribe Tessalon Perles and will screen for COVID and flu.  Will await results for further recommendation otherwise encouraged symptomatic treatment, increase fluids and rest.  Recommend sooner follow-up with any worsening symptoms or further concerns.  Final Clinical Impressions(s) / UC Diagnoses   Final diagnoses:  Acute upper respiratory infection   Discharge Instructions   None    ED Prescriptions     Medication Sig Dispense Auth. Provider   benzonatate (TESSALON) 100 MG capsule Take 1 capsule (100 mg total) by mouth every 8 (eight) hours. 21 capsule Tomi Bamberger, PA-C      PDMP not reviewed this encounter.   Tomi Bamberger, PA-C 03/28/22 [phone]

## 2022-03-29 LAB — RESP PANEL BY RT-PCR (FLU A&B, COVID) ARPGX2
Influenza A by PCR: NEGATIVE
Influenza B by PCR: NEGATIVE
SARS Coronavirus 2 by RT PCR: NEGATIVE

## 2022-04-04 DIAGNOSIS — G4733 Obstructive sleep apnea (adult) (pediatric): Secondary | ICD-10-CM | POA: Diagnosis not present

## 2022-04-29 DIAGNOSIS — G4733 Obstructive sleep apnea (adult) (pediatric): Secondary | ICD-10-CM | POA: Diagnosis not present

## 2022-05-05 DIAGNOSIS — G4733 Obstructive sleep apnea (adult) (pediatric): Secondary | ICD-10-CM | POA: Diagnosis not present

## 2022-05-18 ENCOUNTER — Ambulatory Visit (INDEPENDENT_AMBULATORY_CARE_PROVIDER_SITE_OTHER): Payer: BC Managed Care – PPO | Admitting: Adult Health

## 2022-05-18 ENCOUNTER — Encounter: Payer: Self-pay | Admitting: Adult Health

## 2022-05-18 DIAGNOSIS — G4733 Obstructive sleep apnea (adult) (pediatric): Secondary | ICD-10-CM | POA: Diagnosis not present

## 2022-05-18 DIAGNOSIS — Z720 Tobacco use: Secondary | ICD-10-CM | POA: Diagnosis not present

## 2022-05-18 MED ORDER — VARENICLINE TARTRATE 1 MG PO TABS: 1.0000 mg | ORAL_TABLET | Freq: Two times a day (BID) | ORAL | 2 refills | Status: DC

## 2022-05-18 MED ORDER — VARENICLINE TARTRATE (STARTER) 0.5 MG X 11 & 1 MG X 42 PO TBPK: ORAL_TABLET | ORAL | 0 refills | Status: DC

## 2022-05-18 NOTE — Progress Notes (Signed)
@Patient  ID: , male    DOB: Mar 21, 1981, 41 y.o.   MRN: [id number]  Chief Complaint  Patient presents with   Follow-up    Referring provider: No ref. provider found  HPI: 41 year old male seen for sleep consult Nov 12, 2021 to establish for sleep apnea Patient is a truck driver  TEST/EVENTS :  HST 01/19/2022 19.8 /hr , SpO2 at 74%.    05/18/2022 Follow up : OSA  Patient returns for 34-month follow-up.  Patient has underlying sleep apnea.  Wears a CPAP every single night.  Patient says he does feel that he is benefiting from CPAP with decreased daytime sleepiness.  CPAP download shows excellent compliance with daily average usage at 7 hours.  Patient is on auto CPAP 5 to 15 cm H2O.  AHI is 0.4/hour.  Daily average pressure at 10.8 cm H2O.  Patient says he is working on weight loss.  Was having a hard time losing weight.  Current weight is at 347 pounds with a BMI of 47.  We discussed healthy eating and exercise.   Patient has restarted smoking.  Smokes 1/2 pack cigarettes daily.  Has smoked on and off over the last 20 years.  Has previously used Wellbutrin and Chantix in the past.  Was able to quit for at least 4 months on Chantix.  Would like to restart on Chantix.  Says he tolerated well.  Did not have any significant benefit on Wellbutrin.  Patient denies any severe depression, nightmares or suicidal ideations.  Patient education given on Chantix. We went over in detail helpful quitting techniques.  No Known Allergies  Immunization History  Administered Date(s) Administered   Moderna Sars-Covid-2 Vaccination 04/25/2020, 05/23/2020   Tdap 12/13/2012    No past medical history on file.  Tobacco History: Social History   Tobacco Use  Smoking Status Every Day   Packs/day: 0.50   Types: Cigarettes  Smokeless Tobacco Never  Tobacco Comments   Has smoked since he was 41 years old.   Ready to quit: No Counseling given: Yes Tobacco comments: Has smoked since he was  41 years old.   Outpatient Medications Prior to Visit  Medication Sig Dispense Refill   benzonatate (TESSALON) 100 MG capsule Take 1 capsule (100 mg total) by mouth every 8 (eight) hours. (Patient not taking: Reported on 05/18/2022) 21 capsule 0   buPROPion (WELLBUTRIN XL) 150 MG 24 hr tablet Take 300 mg by mouth every morning. (Patient not taking: Reported on 11/12/2021)     cetirizine (ZYRTEC) 10 MG tablet Take 10 mg by mouth daily. (Patient not taking: Reported on 11/12/2021)     No facility-administered medications prior to visit.     Review of Systems:   Constitutional:   No  weight loss, night sweats,  Fevers, chills, fatigue, or  lassitude.  HEENT:   No headaches,  Difficulty swallowing,  Tooth/dental problems, or  Sore throat,                No sneezing, itching, ear ache, nasal congestion, post nasal drip,   CV:  No chest pain,  Orthopnea, PND, swelling in lower extremities, anasarca, dizziness, palpitations, syncope.   GI  No heartburn, indigestion, abdominal pain, nausea, vomiting, diarrhea, change in bowel habits, loss of appetite, bloody stools.   Resp: No shortness of breath with exertion or at rest.  No excess mucus, no productive cough,  No non-productive cough,  No coughing up of blood.  No change in color of mucus.  No wheezing.  No chest wall deformity  Skin: no rash or lesions.  GU: no dysuria, change in color of urine, no urgency or frequency.  No flank pain, no hematuria   MS:  No joint pain or swelling.  No decreased range of motion.  No back pain.    Physical Exam  BP (!) 130/90 (BP Location: Left Arm, Patient Position: Sitting, Cuff Size: Large)   Pulse 79   Temp 98.7 F (37.1 C) (Oral)   Wt (!) 347 lb 6.4 oz (157.6 kg)   SpO2 97%   BMI 47.12 kg/m   GEN: A/Ox3; pleasant , NAD, well nourished    HEENT:  [place]/AT,  , NOSE-clear, THROAT-clear, no lesions, no postnasal drip or exudate noted. Class 3 MP airway   NECK:  Supple w/ fair ROM; no JVD; normal  carotid impulses w/o bruits; no thyromegaly or nodules palpated; no lymphadenopathy.    RESP  Clear  P & A; w/o, wheezes/ rales/ or rhonchi. no accessory muscle use, no dullness to percussion  CARD:  RRR, no m/r/g, no peripheral edema, pulses intact, no cyanosis or clubbing.  GI:   Soft & nt; nml bowel sounds; no organomegaly or masses detected.   Musco: Warm bil, no deformities or joint swelling noted.   Neuro: alert, no focal deficits noted.    Skin: Warm, no lesions or rashes    Lab Results:  CBC No results found for: "WBC", "RBC", "HGB", "HCT", "PLT", "MCV", "MCH", "MCHC", "RDW", "LYMPHSABS", "MONOABS", "EOSABS", "BASOSABS"  BMET No results found for: "NA", "K", "CL", "CO2", "GLUCOSE", "BUN", "CREATININE", "CALCIUM", "GFRNONAA", "GFRAA"  BNP No results found for: "BNP"  ProBNP No results found for: "PROBNP"  Imaging: No results found.        No data to display          No results found for: "NITRICOXIDE"      Assessment & Plan:   OSA (obstructive sleep apnea) Excellent control compliance on CPAP  Plan  Patient Instructions  Continue on CPAP At bedtime, wear all night long , for at least 6hr each night  Saline nasal spray Twice daily   Saline nasal gel At bedtime   Work on healthy weight loss Do not drive if sleepy Work on not smoking .  1800QuitNow  Set quit date  Begin Chantix starter pack /maintence regimen .  Referral to Healthy weight and wellness.  Follow-up in 3 months and As needed      Morbid obesity (HCC) Healthy weight loss discussed.  Referral to healthy weight and wellness  Plan Patient Instructions  Continue on CPAP At bedtime, wear all night long , for at least 6hr each night  Saline nasal spray Twice daily   Saline nasal gel At bedtime   Work on healthy weight loss Do not drive if sleepy Work on not smoking .  1800QuitNow  Set quit date  Begin Chantix starter pack /maintence regimen .  Referral to Healthy weight and  wellness.  Follow-up in 3 months and As needed      Tobacco abuse Smoking cessation discussed in detail. We will begin Chantix starter pack and then transition to maintenance dose.  Patient education was given.  We will follow back up in 3 months.  Patient is encouraged on setting a quit date.     Rubye Oaks, NP 05/18/2022

## 2022-05-18 NOTE — Assessment & Plan Note (Signed)
Healthy weight loss discussed.  Referral to healthy weight and wellness  Plan Patient Instructions  Continue on CPAP At bedtime, wear all night long , for at least 6hr each night  Saline nasal spray Twice daily   Saline nasal gel At bedtime   Work on healthy weight loss Do not drive if sleepy Work on not smoking .  1800QuitNow  Set quit date  Begin Chantix starter pack /maintence regimen .  Referral to Healthy weight and wellness.  Follow-up in 3 months and As needed

## 2022-05-18 NOTE — Assessment & Plan Note (Signed)
Excellent control compliance on CPAP  Plan  Patient Instructions  Continue on CPAP At bedtime, wear all night long , for at least 6hr each night  Saline nasal spray Twice daily   Saline nasal gel At bedtime   Work on healthy weight loss Do not drive if sleepy Work on not smoking .  1800QuitNow  Set quit date  Begin Chantix starter pack /maintence regimen .  Referral to Healthy weight and wellness.  Follow-up in 3 months and As needed

## 2022-05-18 NOTE — Assessment & Plan Note (Signed)
Smoking cessation discussed in detail. We will begin Chantix starter pack and then transition to maintenance dose.  Patient education was given.  We will follow back up in 3 months.  Patient is encouraged on setting a quit date.

## 2022-05-18 NOTE — Patient Instructions (Addendum)
Continue on CPAP At bedtime, wear all night long , for at least 6hr each night  Saline nasal spray Twice daily   Saline nasal gel At bedtime   Work on healthy weight loss Do not drive if sleepy Work on not smoking .  1800QuitNow  Set quit date  Begin Chantix starter pack /maintence regimen .  Referral to Healthy weight and wellness.  Follow-up in 3 months and As needed

## 2022-05-18 NOTE — Progress Notes (Signed)
Reviewed and agree with assessment/plan.   Estell Dillinger, MD [facility] 05/18/2022, 12:21 PM Pager:  [phone]  

## 2022-05-25 ENCOUNTER — Encounter (INDEPENDENT_AMBULATORY_CARE_PROVIDER_SITE_OTHER): Payer: BC Managed Care – PPO | Admitting: Family Medicine

## 2022-06-04 DIAGNOSIS — G4733 Obstructive sleep apnea (adult) (pediatric): Secondary | ICD-10-CM | POA: Diagnosis not present

## 2022-07-05 DIAGNOSIS — G4733 Obstructive sleep apnea (adult) (pediatric): Secondary | ICD-10-CM | POA: Diagnosis not present

## 2022-07-13 ENCOUNTER — Emergency Department (HOSPITAL_COMMUNITY): Payer: BC Managed Care – PPO

## 2022-07-13 ENCOUNTER — Other Ambulatory Visit: Payer: Self-pay

## 2022-07-13 ENCOUNTER — Emergency Department (HOSPITAL_COMMUNITY)
Admission: EM | Admit: 2022-07-13 | Discharge: 2022-07-14 | Disposition: A | Discharge status: Home / Self Care | Payer: BC Managed Care – PPO | Attending: Emergency Medicine | Admitting: Emergency Medicine

## 2022-07-13 ENCOUNTER — Encounter (HOSPITAL_COMMUNITY): Payer: Self-pay | Admitting: *Deleted

## 2022-07-13 DIAGNOSIS — R197 Diarrhea, unspecified: Secondary | ICD-10-CM | POA: Diagnosis not present

## 2022-07-13 DIAGNOSIS — S20212A Contusion of left front wall of thorax, initial encounter: Secondary | ICD-10-CM | POA: Diagnosis not present

## 2022-07-13 DIAGNOSIS — H538 Other visual disturbances: Secondary | ICD-10-CM | POA: Diagnosis not present

## 2022-07-13 DIAGNOSIS — G43809 Other migraine, not intractable, without status migrainosus: Secondary | ICD-10-CM | POA: Insufficient documentation

## 2022-07-13 DIAGNOSIS — S40012A Contusion of left shoulder, initial encounter: Secondary | ICD-10-CM | POA: Diagnosis not present

## 2022-07-13 DIAGNOSIS — R1030 Lower abdominal pain, unspecified: Secondary | ICD-10-CM | POA: Diagnosis not present

## 2022-07-13 DIAGNOSIS — M25512 Pain in left shoulder: Secondary | ICD-10-CM | POA: Diagnosis not present

## 2022-07-13 DIAGNOSIS — G4730 Sleep apnea, unspecified: Secondary | ICD-10-CM | POA: Diagnosis not present

## 2022-07-13 DIAGNOSIS — M25519 Pain in unspecified shoulder: Secondary | ICD-10-CM | POA: Diagnosis not present

## 2022-07-13 DIAGNOSIS — G43909 Migraine, unspecified, not intractable, without status migrainosus: Secondary | ICD-10-CM | POA: Diagnosis not present

## 2022-07-13 DIAGNOSIS — S299XXA Unspecified injury of thorax, initial encounter: Secondary | ICD-10-CM | POA: Diagnosis not present

## 2022-07-13 DIAGNOSIS — S4992XA Unspecified injury of left shoulder and upper arm, initial encounter: Secondary | ICD-10-CM | POA: Diagnosis not present

## 2022-07-13 DIAGNOSIS — R0789 Other chest pain: Secondary | ICD-10-CM | POA: Diagnosis not present

## 2022-07-13 DIAGNOSIS — S0003XA Contusion of scalp, initial encounter: Secondary | ICD-10-CM | POA: Diagnosis not present

## 2022-07-13 DIAGNOSIS — Y92410 Unspecified street and highway as the place of occurrence of the external cause: Secondary | ICD-10-CM | POA: Insufficient documentation

## 2022-07-13 DIAGNOSIS — S40022A Contusion of left upper arm, initial encounter: Secondary | ICD-10-CM | POA: Diagnosis not present

## 2022-07-13 LAB — CBC WITH DIFFERENTIAL/PLATELET
Abs Immature Granulocytes: 0.01 10*3/uL (ref 0.00–0.07)
Basophils Absolute: 0 10*3/uL (ref 0.0–0.1)
Basophils Relative: 0 %
Eosinophils Absolute: 0.3 10*3/uL (ref 0.0–0.5)
Eosinophils Relative: 4 %
HCT: 39.4 % (ref 39.0–52.0)
Hemoglobin: 12.4 g/dL — ABNORMAL LOW (ref 13.0–17.0)
Immature Granulocytes: 0 %
Lymphocytes Relative: 29 %
Lymphs Abs: 2.4 10*3/uL (ref 0.7–4.0)
MCH: 27.5 pg (ref 26.0–34.0)
MCHC: 31.5 g/dL (ref 30.0–36.0)
MCV: 87.4 fL (ref 80.0–100.0)
Monocytes Absolute: 0.6 10*3/uL (ref 0.1–1.0)
Monocytes Relative: 7 %
Neutro Abs: 4.9 10*3/uL (ref 1.7–7.7)
Neutrophils Relative %: 60 %
Platelets: 268 10*3/uL (ref 150–400)
RBC: 4.51 MIL/uL (ref 4.22–5.81)
RDW: 12.6 % (ref 11.5–15.5)
WBC: 8.3 10*3/uL (ref 4.0–10.5)
nRBC: 0 % (ref 0.0–0.2)

## 2022-07-13 LAB — BASIC METABOLIC PANEL
Anion gap: 9 (ref 5–15)
BUN: 10 mg/dL (ref 6–20)
CO2: 22 mmol/L (ref 22–32)
Calcium: 8.6 mg/dL — ABNORMAL LOW (ref 8.9–10.3)
Chloride: 106 mmol/L (ref 98–111)
Creatinine, Ser: 1.16 mg/dL (ref 0.61–1.24)
GFR, Estimated: >60 mL/min (ref >60)
Glucose, Bld: 132 mg/dL — ABNORMAL HIGH (ref 70–99)
Potassium: 3.6 mmol/L (ref 3.5–5.1)
Sodium: 137 mmol/L (ref 135–145)

## 2022-07-13 MED ORDER — ACETAMINOPHEN 325 MG PO TABS
650.0000 mg | ORAL_TABLET | Freq: Once | ORAL | Status: AC
  Administered 2022-07-13: 650 mg via ORAL
  Filled 2022-07-13: qty 2
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
## 2022-07-14 MED ORDER — LIDOCAINE 5 % EX PTCH
1.0000 | MEDICATED_PATCH | Freq: Once | CUTANEOUS | Status: DC
  Administered 2022-07-14: 1 via TRANSDERMAL
  Filled 2022-07-14: qty 1

## 2022-07-14 MED ORDER — METOCLOPRAMIDE HCL 5 MG/ML IJ SOLN
10.0000 mg | Freq: Once | INTRAMUSCULAR | Status: AC
  Administered 2022-07-14: 10 mg via INTRAMUSCULAR
  Filled 2022-07-14: qty 2

## 2022-07-14 MED ORDER — DEXAMETHASONE SODIUM PHOSPHATE 10 MG/ML IJ SOLN
10.0000 mg | Freq: Once | INTRAMUSCULAR | Status: AC
  Administered 2022-07-14: 10 mg via INTRAMUSCULAR
  Filled 2022-07-14: qty 1
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
## 2022-07-14 NOTE — Discharge Instructions (Signed)
You were evaluated today for visual disturbance in the right eye.  This seems most consistent with an early migraine.  Please take your home medications for migraine treatment.  I do recommend following up with your primary care provider for further evaluation.  I also recommend evaluation by an eye care professional to check for any changes in your underlying vision.  If you develop any life-threatening symptoms please return to the emergency department

## 2022-07-21 DIAGNOSIS — S40029A Contusion of unspecified upper arm, initial encounter: Secondary | ICD-10-CM | POA: Diagnosis not present

## 2022-07-21 DIAGNOSIS — M545 Low back pain, unspecified: Secondary | ICD-10-CM | POA: Diagnosis not present

## 2022-07-21 DIAGNOSIS — S40019A Contusion of unspecified shoulder, initial encounter: Secondary | ICD-10-CM | POA: Diagnosis not present

## 2022-07-21 DIAGNOSIS — M25552 Pain in left hip: Secondary | ICD-10-CM | POA: Diagnosis not present

## 2022-08-18 ENCOUNTER — Ambulatory Visit (INDEPENDENT_AMBULATORY_CARE_PROVIDER_SITE_OTHER): Payer: BC Managed Care – PPO | Admitting: Adult Health

## 2022-08-18 ENCOUNTER — Encounter: Payer: Self-pay | Admitting: Adult Health

## 2022-08-18 VITALS — BP 116/88 | HR 71 | Temp 97.9°F | Ht 72.0 in | Wt 334.4 lb

## 2022-08-18 DIAGNOSIS — G4733 Obstructive sleep apnea (adult) (pediatric): Secondary | ICD-10-CM | POA: Diagnosis not present

## 2022-08-18 DIAGNOSIS — F17210 Nicotine dependence, cigarettes, uncomplicated: Secondary | ICD-10-CM

## 2022-08-18 DIAGNOSIS — Z720 Tobacco use: Secondary | ICD-10-CM

## 2022-08-18 MED ORDER — VARENICLINE TARTRATE (STARTER) 0.5 MG X 11 & 1 MG X 42 PO TBPK: ORAL_TABLET | ORAL | 0 refills | Status: DC

## 2022-08-18 MED ORDER — VARENICLINE TARTRATE 1 MG PO TABS: 1.0000 mg | ORAL_TABLET | Freq: Two times a day (BID) | ORAL | 2 refills | Status: DC

## 2022-08-18 NOTE — Patient Instructions (Signed)
Continue on CPAP At bedtime, wear all night long , for at least 6hr each night  Saline nasal spray Twice daily   Saline nasal gel At bedtime   Work on healthy weight loss Do not drive if sleepy Work on not smoking .  1800QuitNow  Set quit date  Restart Chantix starter pack /maintence regimen .  Follow-up in 2 months with Dr. Ander Slade or Rorey Bisson NP  and As needed

## 2022-08-18 NOTE — Progress Notes (Signed)
$'@Patient'w$  ID: Adam Rasmussen, male    DOB: May 30, 1981, 42 y.o.   MRN: [id number]  Chief Complaint  Patient presents with   Follow-up    Referring provider: No ref. 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     08/18/2022 Follow up : OSA Patient returns for 27-monthfollow-up.  Patient has underlying moderate sleep apnea.  Patient says he is trying to wear his CPAP every night.  However he works very long hours at times.  Makes it hard to know when he needs to take his CPAP on the road.  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  Patient says sometimes his shifts can be very long greater than 12 to 14 hours.  Patient says sometimes even when he wears the CPAP he still feels tired the next day.  CPAP download shows 77% compliance.  Daily average usage at 7 hours.  Patient is on AutoSet 5 to 15 cm H2O.  AHI 0.5/hour.  Daily average pressure 10.3 cm H2O.  Patient does continue to smoke.  We discussed smoking cessation.  He was given Chantix last visit and says it was starting to work to help him quit smoking however he lost his medication and would like a refill. Says he is tolerating well with no known side effects.  Denies any depression like symptoms.  No headache.   No Known Allergies  Immunization History  Administered Date(s) Administered   Moderna Sars-Covid-2 Vaccination 04/25/2020, 05/23/2020   Tdap 12/13/2012    No past medical history on file.  Tobacco History: Social History   Tobacco Use  Smoking Status Every Day   Packs/day: 0.50   Types: Cigarettes  Smokeless Tobacco Never  Tobacco Comments   Has smoked since he was 42years old.  08/18/2022 hfb   Ready to quit: Not Answered Counseling given: Not Answered Tobacco comments: Has smoked since he was 42years old.  08/18/2022 hfb   Outpatient Medications  Prior to Visit  Medication Sig Dispense Refill   varenicline (CHANTIX CONTINUING MONTH PAK) 1 MG tablet Take 1 tablet (1 mg total) by mouth 2 (two) times daily. 60 tablet 2   Varenicline Tartrate, Starter, (CHANTIX STARTING MONTH PAK) 0.5 MG X 11 & 1 MG X 42 TBPK Day 1-3 Use 0.'5mg'$  daily , Day 4-7 Use 0.'5mg'$  Twice daily  , then 1 mg Twice daily 1 each 0   No facility-administered medications prior to visit.     Review of Systems:   Constitutional:   No  weight loss, night sweats,  Fevers, chills,  +fatigue, or  lassitude.  HEENT:   No headaches,  Difficulty swallowing,  Tooth/dental problems, or  Sore throat,                No sneezing, itching, ear ache, nasal congestion, post nasal drip,   CV:  No chest pain,  Orthopnea, PND, swelling in lower extremities, anasarca, dizziness, palpitations, syncope.   GI  No heartburn, indigestion, abdominal pain, nausea, vomiting, diarrhea, change in bowel habits, loss of appetite, bloody stools.   Resp: No shortness of breath with exertion or at rest.  No excess mucus, no productive cough,  No non-productive cough,  No coughing up of blood.  No change in color of mucus.  No wheezing.  No chest wall deformity  Skin: no rash or lesions.  GU: no dysuria, change in color of urine, no urgency or frequency.  No flank pain, no hematuria   MS:  No joint pain or swelling.  No decreased range of motion.  No back pain.    Physical Exam  BP 116/88 (BP Location: Left Arm, Patient Position: Sitting, Cuff Size: Large)   Pulse 71   Temp 97.9 F (36.6 C) (Oral)   Ht 6' (1.829 m)   Wt (!) 334 lb 6.4 oz (151.7 kg)   SpO2 100%   BMI 45.35 kg/m   GEN: A/Ox3; pleasant , NAD, well nourished    HEENT:  [place]/AT,   NOSE-clear, THROAT-clear, no lesions, no postnasal drip or exudate noted.  Class III MP airway  NECK:  Supple w/ fair ROM; no JVD; normal carotid impulses w/o bruits; no thyromegaly or nodules palpated; no lymphadenopathy.    RESP  Clear  P & A;  w/o, wheezes/ rales/ or rhonchi. no accessory muscle use, no dullness to percussion  CARD:  RRR, no m/r/g, no peripheral edema, pulses intact, no cyanosis or clubbing.  GI:   Soft & nt; nml bowel sounds; no organomegaly or masses detected.   Musco: Warm bil, no deformities or joint swelling noted.   Neuro: alert, no focal deficits noted.    Skin: Warm, no lesions or rashes    Lab Results:   BNP No results found for: "BNP"  ProBNP No results found for: "PROBNP"  Imaging: No results found.        No data to display          No results found for: "NITRICOXIDE"      Assessment & Plan:   No problem-specific Assessment & Plan notes found for this encounter.     Rexene Edison, NP 08/18/2022

## 2022-08-22 NOTE — Assessment & Plan Note (Signed)
Encouraged on CPAP compliance.  If on return visit patient is still having daytime sleepiness despite daily usage may need to evaluate for hypersomnia  Plan  Patient Instructions  Continue on CPAP At bedtime, wear all night long , for at least 6hr each night  Saline nasal spray Twice daily   Saline nasal gel At bedtime   Work on healthy weight loss Do not drive if sleepy Work on not smoking .  1800QuitNow  Set quit date  Restart Chantix starter pack /maintence regimen .  Follow-up in 2 months with Dr. Ander Slade or Scorpio Fortin NP  and As needed

## 2022-08-22 NOTE — Progress Notes (Signed)
Reviewed and agree with assessment/plan.   Chesley Mires, MD [facility] Pulmonary/Critical Care 08/22/2022, 9:09 AM Pager:  [phone]

## 2022-08-22 NOTE — Assessment & Plan Note (Signed)
Smoking cessation discussed in detail.  Chantix refill.  Patient education on Chantix.

## 2022-08-29 DIAGNOSIS — R7303 Prediabetes: Secondary | ICD-10-CM | POA: Diagnosis not present

## 2022-08-29 DIAGNOSIS — Z0000 Encounter for general adult medical examination without abnormal findings: Secondary | ICD-10-CM | POA: Diagnosis not present

## 2022-08-29 DIAGNOSIS — G4733 Obstructive sleep apnea (adult) (pediatric): Secondary | ICD-10-CM | POA: Diagnosis not present

## 2022-08-29 DIAGNOSIS — Z6841 Body Mass Index (BMI) 40.0 and over, adult: Secondary | ICD-10-CM | POA: Diagnosis not present

## 2022-08-29 DIAGNOSIS — Z720 Tobacco use: Secondary | ICD-10-CM | POA: Diagnosis not present

## 2022-08-29 DIAGNOSIS — Z1330 Encounter for screening examination for mental health and behavioral disorders, unspecified: Secondary | ICD-10-CM | POA: Diagnosis not present

## 2022-08-29 DIAGNOSIS — F3341 Major depressive disorder, recurrent, in partial remission: Secondary | ICD-10-CM | POA: Diagnosis not present

## 2022-09-29 ENCOUNTER — Encounter: Payer: Self-pay | Admitting: Pulmonary Disease

## 2022-09-29 ENCOUNTER — Ambulatory Visit: Payer: BC Managed Care – PPO | Admitting: Pulmonary Disease

## 2022-09-29 ENCOUNTER — Ambulatory Visit (INDEPENDENT_AMBULATORY_CARE_PROVIDER_SITE_OTHER): Payer: BC Managed Care – PPO | Admitting: Pulmonary Disease

## 2022-09-29 VITALS — BP 128/70 | HR 66 | Ht 72.0 in | Wt 344.0 lb

## 2022-09-29 DIAGNOSIS — G4733 Obstructive sleep apnea (adult) (pediatric): Secondary | ICD-10-CM | POA: Diagnosis not present

## 2022-09-29 NOTE — Patient Instructions (Signed)
Your CPAP shows it is working very well Number of events is well-controlled  For your daytime sleepiness Trying caffeinated beverages Caffeine pills-try to keep dosage less than 400 mg a day -May help you stay more alert  Always make sure you get about 6 to 8 hours of sleep every night  Continue weight loss efforts  Follow-up in 6 months

## 2022-09-29 NOTE — Progress Notes (Signed)
Adam Rasmussen    [id number]    [birth year]  Primary Care Physician:[facility]  Referring Physician: [facility] [address] [place],  [place] [address]  Chief complaint:   Patient with a history of moderate obstructive sleep apnea  HPI:  Has been using CPAP nightly Benefits from CPAP use Wakes up feeling like he is at a good nights rest  May feel sleepy about 2 hours after waking up  Drives as a trucker  Currently on Chantix, working to try and quit smoking  Outpatient Encounter Medications as of 09/29/2022  Medication Sig   varenicline (CHANTIX CONTINUING MONTH PAK) 1 MG tablet Take 1 tablet (1 mg total) by mouth 2 (two) times daily.   Varenicline Tartrate, Starter, (CHANTIX STARTING MONTH PAK) 0.5 MG X 11 & 1 MG X 42 TBPK Day 1-3 Use 0.5mg  daily , Day 4-7 Use 0.5mg  Twice daily  , then 1 mg Twice daily   No facility-administered encounter medications on file as of 09/29/2022.    Allergies as of 09/29/2022   (No Known Allergies)    No past medical history on file.  No past surgical history on file.  No family history on file.  Social History   Socioeconomic History   Marital status: Married    Spouse name: Not on file   Number of children: Not on file   Years of education: Not on file   Highest education level: Not on file  Occupational History   Not on file  Tobacco Use   Smoking status: Every Day    Packs/day: .5    Types: Cigarettes   Smokeless tobacco: Never   Tobacco comments:    2-3 cigarettes a week   Substance and Sexual Activity   Alcohol use: Yes    Comment: occasionally   Drug use: Never   Sexual activity: Not on file  Other Topics Concern   Not on file  Social History Narrative   Not on file   Social Determinants of Health   Financial Resource Strain: Not on file  Food Insecurity: Not on file  Transportation Needs: Not on file  Physical  Activity: Not on file  Stress: Not on file  Social Connections: Not on file  Intimate Partner Violence: Not on file    Review of Systems  Constitutional:  Negative for activity change.  Respiratory:  Positive for apnea.   Psychiatric/Behavioral:  Positive for sleep disturbance.     Vitals:   09/29/22 1355  BP: 128/70  Pulse: 66  SpO2: 97%     Physical Exam Constitutional:      Appearance: He is obese.  HENT:     Head: Normocephalic.     Mouth/Throat:     Mouth: Mucous membranes are moist.  Eyes:     General: No scleral icterus. Cardiovascular:     Rate and Rhythm: Normal rate.     Heart sounds: No murmur heard.    No friction rub.  Pulmonary:     Effort: No respiratory distress.     Breath sounds: No stridor. No wheezing or rhonchi.  Musculoskeletal:     Cervical back: No rigidity or tenderness.  Neurological:     Mental Status: He is alert.  Psychiatric:        Mood and Affect: Mood normal.      Data Reviewed: Compliance data reviewed showing excellent compliance with CPAP Auto CPAP  10-15 Residual AHI of 0.4  Assessment:  Obstructive sleep apnea appears adequately treated with CPAP therapy  Class III obesity  Pathophysiology of sleep disordered breathing discussed  Plan/Recommendations: Continue using CPAP nightly  Try and get about 6 to 8 hours of sleep nightly  Caffeine pills may help daytime sleepiness  Prescribed medications-stimulants may need to be considered if you are still very sleepy despite using CPAP regularly  Weight loss efforts     Virl Diamond MD [place] [facility] 09/29/2022, 2:09 PM  CC: [facility]

## 2022-10-15 ENCOUNTER — Encounter (HOSPITAL_COMMUNITY): Payer: Self-pay

## 2022-10-15 ENCOUNTER — Emergency Department (HOSPITAL_COMMUNITY)
Admission: EM | Admit: 2022-10-15 | Discharge: 2022-10-15 | Disposition: A | Discharge status: Home / Self Care | Payer: BC Managed Care – PPO | Attending: Emergency Medicine | Admitting: Emergency Medicine

## 2022-10-15 ENCOUNTER — Other Ambulatory Visit: Payer: Self-pay

## 2022-10-15 DIAGNOSIS — R197 Diarrhea, unspecified: Secondary | ICD-10-CM

## 2022-10-15 DIAGNOSIS — R1030 Lower abdominal pain, unspecified: Secondary | ICD-10-CM | POA: Diagnosis not present

## 2022-10-15 LAB — CBC
HCT: 40.5 % (ref 39.0–52.0)
Hemoglobin: 12.8 g/dL — ABNORMAL LOW (ref 13.0–17.0)
MCH: 27.5 pg (ref 26.0–34.0)
MCHC: 31.6 g/dL (ref 30.0–36.0)
MCV: 86.9 fL (ref 80.0–100.0)
Platelets: 286 10*3/uL (ref 150–400)
RBC: 4.66 MIL/uL (ref 4.22–5.81)
RDW: 13 % (ref 11.5–15.5)
WBC: 7.5 10*3/uL (ref 4.0–10.5)
nRBC: 0 % (ref 0.0–0.2)

## 2022-10-15 LAB — COMPREHENSIVE METABOLIC PANEL
ALT: 34 U/L (ref 0–44)
AST: 26 U/L (ref 15–41)
Albumin: 4.1 g/dL (ref 3.5–5.0)
Alkaline Phosphatase: 66 U/L (ref 38–126)
Anion gap: 10 (ref 5–15)
BUN: 8 mg/dL (ref 6–20)
CO2: 22 mmol/L (ref 22–32)
Calcium: 8.5 mg/dL — ABNORMAL LOW (ref 8.9–10.3)
Chloride: 103 mmol/L (ref 98–111)
Creatinine, Ser: 1.12 mg/dL (ref 0.61–1.24)
GFR, Estimated: >60 mL/min (ref >60)
Glucose, Bld: 98 mg/dL (ref 70–99)
Potassium: 4 mmol/L (ref 3.5–5.1)
Sodium: 135 mmol/L (ref 135–145)
Total Bilirubin: 0.5 mg/dL (ref 0.3–1.2)
Total Protein: 7.5 g/dL (ref 6.5–8.1)

## 2022-10-15 LAB — PROTIME-INR
INR: 1 (ref 0.8–1.2)
Prothrombin Time: 13.3 seconds (ref 11.4–15.2)

## 2022-10-15 LAB — LIPASE, BLOOD: Lipase: 28 U/L (ref 11–51)

## 2022-10-15 LAB — TYPE AND SCREEN
ABO/RH(D): O POS
Antibody Screen: NEGATIVE

## 2022-10-15 MED ORDER — AMOXICILLIN-POT CLAVULANATE 875-125 MG PO TABS
1.0000 | ORAL_TABLET | Freq: Once | ORAL | Status: AC
  Administered 2022-10-15: 1 via ORAL
  Filled 2022-10-15: qty 1

## 2022-10-15 MED ORDER — ONDANSETRON HCL 4 MG PO TABS: 4.0000 mg | ORAL_TABLET | Freq: Three times a day (TID) | ORAL | 0 refills | Status: DC | PRN

## 2022-10-15 MED ORDER — AMOXICILLIN-POT CLAVULANATE 875-125 MG PO TABS: 1.0000 | ORAL_TABLET | Freq: Two times a day (BID) | ORAL | 0 refills | Status: AC

## 2022-10-15 NOTE — ED Triage Notes (Signed)
Pt to er, pt states that he has had three episodes of bright red bloody diarrhea.  States that he gets really bad cramping in his abd and then he has the diarrhea. Denies similar episodes

## 2022-10-15 NOTE — ED Provider Triage Note (Signed)
Emergency Medicine Provider Triage Evaluation Note  Adam Rasmussen , a 42 y.o. male  was evaluated in triage.  Pt complains of diffuse abdominal cramping mostly on right side and bloody diarrhea since last night. Too many episodes of grossly bloody diarrhea to count. Constipation preceding this. No fever, nausea, vomiting, chest pain, shortness of breath, or urinary symptoms. No anticoagulant use. No hx GI bleed. No known sick contacts. No previous abdominal surgeries. No history of significant anemia or requiring blood transfusion.   Review of Systems  Positive: See HPI Negative: See HPI  Physical Exam  BP (!) 184/110 (BP Location: Left Arm)   Pulse 84   Temp 98.8 F (37.1 C) (Oral)   Resp 18   Ht 6' (1.829 m)   Wt (!) 156 kg   SpO2 96%   BMI 46.65 kg/m  Gen:   Awake, no distress   Resp:  Normal effort  MSK:   Moves extremities without difficulty  Other:  Diffuse abdominal tenderness worse to RUQ with positive guarding, no rebound, mild distension  Medical Decision Making  Medically screening exam initiated at 6:21 PM.  Appropriate orders placed.  Misha Antonini was informed that the remainder of the evaluation will be completed by another provider, this initial triage assessment does not replace that evaluation, and the importance of remaining in the ED until their evaluation is complete.     Tonette Lederer, PA-C 10/15/22 1824

## 2022-10-15 NOTE — ED Provider Notes (Signed)
[place] EMERGENCY DEPARTMENT AT [facility] Provider Note   CSN: [id number] Arrival date & time: 10/15/22  1750     History  Chief Complaint  Patient presents with   Abdominal Pain    Adam Rasmussen is a 42 y.o. male presenting to the ED with lower abdominal pain, diarrhea or blood in stool.  He reports he began to feel this way last night.  Had 3 episodes of loose bowel movements today, each of which had bright blood in the stool as well as clots in the stool.  He denies lightheadedness.  Denies anticoagulation.  Denies fevers or chills.  HPI     Home Medications Prior to Admission medications   Medication Sig Start Date End Date Taking? Authorizing Provider  amoxicillin-clavulanate (AUGMENTIN) 875-125 MG tablet Take 1 tablet by mouth 2 (two) times daily for 7 days. 10/16/22 10/23/22 Yes Lawayne Hartig, Kermit Balo, MD  varenicline (CHANTIX CONTINUING MONTH PAK) 1 MG tablet Take 1 tablet (1 mg total) by mouth 2 (two) times daily. 08/18/22   Parrett, Virgel Bouquet, NP  Varenicline Tartrate, Starter, (CHANTIX STARTING MONTH PAK) 0.5 MG X 11 & 1 MG X 42 TBPK Day 1-3 Use 0.5mg  daily , Day 4-7 Use 0.5mg  Twice daily  , then 1 mg Twice daily 08/18/22   Parrett, Virgel Bouquet, NP      Allergies    Patient has no known allergies.    Review of Systems   Review of Systems  Physical Exam Updated Vital Signs BP (!) 140/86   Pulse 91   Temp 98.8 F (37.1 C) (Oral)   Resp 19   Ht 6' (1.829 m)   Wt (!) 156 kg   SpO2 97%   BMI 46.65 kg/m  Physical Exam Constitutional:      General: He is not in acute distress.    Appearance: He is obese.  HENT:     Head: Normocephalic and atraumatic.  Eyes:     Conjunctiva/sclera: Conjunctivae normal.     Pupils: Pupils are equal, round, and reactive to light.  Cardiovascular:     Rate and Rhythm: Normal rate and regular rhythm.  Pulmonary:     Effort: Pulmonary effort is normal. No respiratory distress.  Abdominal:     General: There is no distension.      Tenderness: There is no abdominal tenderness. There is no guarding. Negative signs include Murphy's sign.  Skin:    General: Skin is warm and dry.  Neurological:     General: No focal deficit present.     Mental Status: He is alert. Mental status is at baseline.  Psychiatric:        Mood and Affect: Mood normal.        Behavior: Behavior normal.     ED Results / Procedures / Treatments   Labs (all labs ordered are listed, but only abnormal results are displayed) Labs Reviewed  COMPREHENSIVE METABOLIC PANEL - Abnormal; Notable for the following components:      Result Value   Calcium 8.5 (*)    All other components within normal limits  CBC - Abnormal; Notable for the following components:   Hemoglobin 12.8 (*)    All other components within normal limits  LIPASE, BLOOD  PROTIME-INR  URINALYSIS, ROUTINE W REFLEX MICROSCOPIC  TYPE AND SCREEN    EKG None  Radiology No results found.  Procedures Procedures    Medications Ordered in ED Medications  amoxicillin-clavulanate (AUGMENTIN) 875-125 MG per tablet 1 tablet (has  no administration in time range)    ED Course/ Medical Decision Making/ A&P                             Medical Decision Making Amount and/or Complexity of Data Reviewed Labs: ordered.  Risk Prescription drug management.   This patient presents to the ED with concern for diarrhea with bloody stool. This involves an extensive number of treatment options, and is a complaint that carries with it a high risk of complications and morbidity.  The differential diagnosis includes colitis versus inflammatory colitis versus diverticulitis versus other  I ordered and personally interpreted labs.  The pertinent results include: No emergent findings.  Hemoglobin is 12.8, close to baseline from 3 months ago (12.4).  White blood cell count 7.5.   I ordered medication including potential for presumed infectious colitis  I have reviewed the patients home  medicines and have made adjustments as needed  Test Considered: Believe there is an indication for CT imaging at this time.  Doubt bowel perforation.  This may be a mild colitis.  Can treat with antibiotics  After the interventions noted above, I reevaluated the patient and found that they have: stayed the same   Dispostion:  After consideration of the diagnostic results and the patients response to treatment, I feel that the patent would benefit from outpatient PCP follow-up         Final Clinical Impression(s) / ED Diagnoses Final diagnoses:  Diarrhea of presumed infectious origin    Rx / DC Orders ED Discharge Orders          Ordered    amoxicillin-clavulanate (AUGMENTIN) 875-125 MG tablet  2 times daily        10/15/22 2047              Terald Sleeper, MD 10/15/22 2048

## 2022-11-19 DIAGNOSIS — Z419 Encounter for procedure for purposes other than remedying health state, unspecified: Secondary | ICD-10-CM | POA: Diagnosis not present

## 2022-12-19 DIAGNOSIS — Z419 Encounter for procedure for purposes other than remedying health state, unspecified: Secondary | ICD-10-CM | POA: Diagnosis not present

## 2023-01-05 NOTE — Telephone Encounter (Signed)
Patient checking on message for sleep study records. Patient phone number is [phone].

## 2023-01-06 NOTE — Telephone Encounter (Signed)
Patient states needs CPAP readings. See message sent by patient. Patient phone number is [phone].

## 2023-01-09 NOTE — Telephone Encounter (Signed)
Printed out for pt. Nothing further

## 2023-01-09 NOTE — Telephone Encounter (Signed)
PT states they did not get his "Sleep Reading" at the last fax number Please refax to: [phone] and this # too [phone]

## 2023-01-09 NOTE — Telephone Encounter (Signed)
Patient calling again.  Please advise

## 2023-01-19 DIAGNOSIS — Z419 Encounter for procedure for purposes other than remedying health state, unspecified: Secondary | ICD-10-CM | POA: Diagnosis not present

## 2023-02-19 DIAGNOSIS — Z419 Encounter for procedure for purposes other than remedying health state, unspecified: Secondary | ICD-10-CM | POA: Diagnosis not present

## 2023-03-21 DIAGNOSIS — Z419 Encounter for procedure for purposes other than remedying health state, unspecified: Secondary | ICD-10-CM | POA: Diagnosis not present

## 2023-04-21 DIAGNOSIS — Z419 Encounter for procedure for purposes other than remedying health state, unspecified: Secondary | ICD-10-CM | POA: Diagnosis not present

## 2023-05-21 DIAGNOSIS — Z419 Encounter for procedure for purposes other than remedying health state, unspecified: Secondary | ICD-10-CM | POA: Diagnosis not present

## 2023-06-21 DIAGNOSIS — Z419 Encounter for procedure for purposes other than remedying health state, unspecified: Secondary | ICD-10-CM | POA: Diagnosis not present

## 2023-06-26 ENCOUNTER — Telehealth: Payer: Self-pay | Admitting: Pulmonary Disease

## 2023-06-26 NOTE — Telephone Encounter (Signed)
 Fax error will try to fax again

## 2023-06-26 NOTE — Telephone Encounter (Signed)
 Received fax confirmation, nfn

## 2023-06-26 NOTE — Telephone Encounter (Signed)
 Called and spoke with pt. Compliance report has been printed and faxed to the number provided. Awaiting fax confirmation

## 2023-06-26 NOTE — Telephone Encounter (Signed)
 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   Fax [phone]  Attn: [employer]

## 2023-07-01 NOTE — Telephone Encounter (Signed)
 Please see new mychart sent by pt with included fax number that he needs to have 90-day download sent to.

## 2023-07-05 NOTE — Telephone Encounter (Signed)
 Good afternoon, I have printed out the 90 day download and faxed the report to the fax number you provided.  Have a good day.

## 2023-07-05 NOTE — Telephone Encounter (Signed)
 Attempted to fax to number provided and received fax error, no answer.  Faxed again,  it continues to redial.

## 2023-07-22 DIAGNOSIS — Z419 Encounter for procedure for purposes other than remedying health state, unspecified: Secondary | ICD-10-CM | POA: Diagnosis not present

## 2023-08-19 DIAGNOSIS — Z419 Encounter for procedure for purposes other than remedying health state, unspecified: Secondary | ICD-10-CM | POA: Diagnosis not present

## 2023-09-14 DIAGNOSIS — R0789 Other chest pain: Secondary | ICD-10-CM | POA: Diagnosis not present

## 2023-09-14 DIAGNOSIS — R030 Elevated blood-pressure reading, without diagnosis of hypertension: Secondary | ICD-10-CM | POA: Diagnosis not present

## 2023-09-14 DIAGNOSIS — R7303 Prediabetes: Secondary | ICD-10-CM | POA: Diagnosis not present

## 2023-09-14 DIAGNOSIS — H6993 Unspecified Eustachian tube disorder, bilateral: Secondary | ICD-10-CM | POA: Diagnosis not present

## 2023-09-14 DIAGNOSIS — R079 Chest pain, unspecified: Secondary | ICD-10-CM | POA: Diagnosis not present

## 2023-09-30 DIAGNOSIS — Z419 Encounter for procedure for purposes other than remedying health state, unspecified: Secondary | ICD-10-CM | POA: Diagnosis not present

## 2023-10-16 DIAGNOSIS — R079 Chest pain, unspecified: Secondary | ICD-10-CM | POA: Diagnosis not present

## 2023-10-30 DIAGNOSIS — Z419 Encounter for procedure for purposes other than remedying health state, unspecified: Secondary | ICD-10-CM | POA: Diagnosis not present

## 2023-11-30 DIAGNOSIS — Z419 Encounter for procedure for purposes other than remedying health state, unspecified: Secondary | ICD-10-CM | POA: Diagnosis not present

## 2023-12-30 DIAGNOSIS — Z419 Encounter for procedure for purposes other than remedying health state, unspecified: Secondary | ICD-10-CM | POA: Diagnosis not present

## 2024-01-30 DIAGNOSIS — Z419 Encounter for procedure for purposes other than remedying health state, unspecified: Secondary | ICD-10-CM | POA: Diagnosis not present

## 2024-03-01 DIAGNOSIS — Z419 Encounter for procedure for purposes other than remedying health state, unspecified: Secondary | ICD-10-CM | POA: Diagnosis not present

## 2024-03-31 DIAGNOSIS — Z419 Encounter for procedure for purposes other than remedying health state, unspecified: Secondary | ICD-10-CM | POA: Diagnosis not present

## 2024-04-24 ENCOUNTER — Ambulatory Visit: Admission: EM | Admit: 2024-04-24 | Discharge: 2024-04-24 | Disposition: A | Discharge status: Home / Self Care

## 2024-04-24 ENCOUNTER — Other Ambulatory Visit: Payer: Self-pay

## 2024-04-24 ENCOUNTER — Emergency Department (HOSPITAL_COMMUNITY)

## 2024-04-24 ENCOUNTER — Encounter: Payer: Self-pay | Admitting: Emergency Medicine

## 2024-04-24 ENCOUNTER — Emergency Department (HOSPITAL_COMMUNITY)
Admission: EM | Admit: 2024-04-24 | Discharge: 2024-04-24 | Disposition: A | Discharge status: Home / Self Care | Source: Ambulatory Visit | Attending: Emergency Medicine | Admitting: Emergency Medicine

## 2024-04-24 DIAGNOSIS — R079 Chest pain, unspecified: Secondary | ICD-10-CM

## 2024-04-24 DIAGNOSIS — R42 Dizziness and giddiness: Secondary | ICD-10-CM | POA: Diagnosis not present

## 2024-04-24 DIAGNOSIS — R0789 Other chest pain: Secondary | ICD-10-CM | POA: Insufficient documentation

## 2024-04-24 DIAGNOSIS — M79602 Pain in left arm: Secondary | ICD-10-CM | POA: Diagnosis not present

## 2024-04-24 DIAGNOSIS — M79605 Pain in left leg: Secondary | ICD-10-CM | POA: Insufficient documentation

## 2024-04-24 DIAGNOSIS — G4733 Obstructive sleep apnea (adult) (pediatric): Secondary | ICD-10-CM | POA: Diagnosis not present

## 2024-04-24 DIAGNOSIS — Z6841 Body Mass Index (BMI) 40.0 and over, adult: Secondary | ICD-10-CM | POA: Diagnosis not present

## 2024-04-24 LAB — BASIC METABOLIC PANEL WITH GFR
Anion gap: 7 (ref 5–15)
BUN: 12 mg/dL (ref 6–20)
CO2: 22 mmol/L (ref 22–32)
Calcium: 8.5 mg/dL — ABNORMAL LOW (ref 8.9–10.3)
Chloride: 109 mmol/L (ref 98–111)
Creatinine, Ser: 1.17 mg/dL (ref 0.61–1.24)
GFR, Estimated: >60 mL/min (ref >60)
Glucose, Bld: 112 mg/dL — ABNORMAL HIGH (ref 70–99)
Potassium: 4.3 mmol/L (ref 3.5–5.1)
Sodium: 138 mmol/L (ref 135–145)

## 2024-04-24 LAB — CBC
HCT: 40.4 % (ref 39.0–52.0)
Hemoglobin: 13 g/dL (ref 13.0–17.0)
MCH: 28.2 pg (ref 26.0–34.0)
MCHC: 32.2 g/dL (ref 30.0–36.0)
MCV: 87.6 fL (ref 80.0–100.0)
Platelets: 281 K/uL (ref 150–400)
RBC: 4.61 MIL/uL (ref 4.22–5.81)
RDW: 12.8 % (ref 11.5–15.5)
WBC: 9.4 K/uL (ref 4.0–10.5)
nRBC: 0 % (ref 0.0–0.2)

## 2024-04-24 LAB — TROPONIN I (HIGH SENSITIVITY)
Troponin I (High Sensitivity): <2 ng/L (ref <18)
Troponin I (High Sensitivity): <2 ng/L (ref <18)

## 2024-04-24 NOTE — ED Triage Notes (Addendum)
 Pt reports chest tightness that started this morning in L chest. A few mins later he experienced tingling down L arm and L leg. Denies numbness. Also felt dizzy like he might lose his balance his work. 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 Denies cardiac hx or hx of asthma or anxiety.  No recent cold symptoms. Pt reports episode like this in early 2025 that sent him to ER - diagnosed with panic attack. Pt thought a migraine was about to start and took excedrin this morning. No other Rx meds or meds used for symptoms. Pt does smoke cigarettes daily. Denies SOB or palpitations.

## 2024-04-24 NOTE — ED Provider Notes (Signed)
 [facility]    CSN: [id number] Arrival date & time: 04/24/24  0805      History   Chief Complaint Chief Complaint  Patient presents with   Chest Pain   Tingling   Dizziness    HPI Adam Rasmussen is a 42 y.o. male.   Patient with history of tobacco use (half a pack a day), obstructive sleep apnea, and morbid obesity presents today due to chest pain that started this morning at 7 AM.  Patient states that chest pain feels like a tight squeeze around his heart on the left side of his chest with numbness and tingling down his left arm initially that turned to cool sensation down left arm and down left leg.  Patient denies shortness of breath, back pain, or headache.  Patient states that he does have some blurred vision, dizziness, and nausea.  Patient states he took Excedrin because he felt like he was about to get a migraine.  Patient states that he had something similar to this earlier this year which was extracted most of the panic attack.  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  Patient denies taking medications daily for anything.  The history is provided by the patient.  Chest Pain Associated symptoms: dizziness   Dizziness Associated symptoms: chest pain     History reviewed. No pertinent past medical history.  Patient Active Problem List   Diagnosis Date Noted   Tobacco abuse 05/18/2022   OSA (obstructive sleep apnea) 11/12/2021   Morbid obesity (HCC) 11/12/2021   Moderate episode of recurrent major depressive disorder (HCC) 10/21/2021   Prediabetes 07/29/2020   Encounter for commercial driving license (CDL) exam 93/77/7978   Morbid obesity with BMI of 45.0-49.9, adult (HCC) 08/23/2018   Class 3 severe obesity due to excess calories with body mass index (BMI) of 40.0 to 44.9 in adult [facility]) 03/03/2016   Testicular mass 03/03/2016   Night sweats 03/03/2016   Elevated blood  sugar 03/03/2016   Other injury of muscle, fascia and tendon of long head of biceps, right arm, subsequent encounter 01/07/2015    Past Surgical History:  Procedure Laterality Date   BICEPS TENDON REPAIR     KNEE SURGERY Left        Home Medications    Prior to Admission medications   Medication Sig Start Date End Date Taking? Authorizing Provider  cetirizine (ZYRTEC) 10 MG tablet Take 10 mg by mouth. 09/14/23 09/13/24  [provider]  fluticasone (FLONASE) 50 MCG/ACT nasal spray Place 1 spray into the nose. 09/14/23 09/13/24  [provider]  ibuprofen (ADVIL) 800 MG tablet Take 800 mg by mouth. 07/13/22   [provider]  methocarbamol (ROBAXIN) 500 MG tablet Take 500 mg by mouth. Patient not taking: Reported on 04/24/2024 07/21/22   [provider]  ondansetron  (ZOFRAN ) 4 MG tablet Take 1 tablet (4 mg total) by mouth every 8 (eight) hours as needed for up to 12 doses for nausea or vomiting. Patient not taking: Reported on 04/24/2024 10/15/22   Cottie Donnice PARAS, MD  varenicline  (CHANTIX  CONTINUING MONTH PAK) 1 MG tablet Take 1 tablet (1 mg total) by mouth 2 (two) times daily. Patient not taking: Reported on 04/24/2024 08/18/22   Parrett, Madelin RAMAN, NP  Varenicline  Tartrate, Starter, (CHANTIX  STARTING MONTH PAK) 0.5 MG X 11 & 1 MG X 42 TBPK Day 1-3 Use 0.5mg  daily , Day 4-7  Use 0.5mg  Twice daily  , then 1 mg Twice daily Patient not taking: Reported on 04/24/2024 08/18/22   Parrett, Madelin RAMAN, NP    Family History History reviewed. No pertinent family history.  Social History Social History   Tobacco Use   Smoking status: Every Day    Current packs/day: 0.00    Types: Cigarettes    Last attempt to quit: 09/19/2022    Years since quitting: 1.5    Passive exposure: Current   Smokeless tobacco: Never   Tobacco comments:    2-3 cigarettes a week   Vaping Use   Vaping status: Never Used  Substance Use Topics   Alcohol use: Yes   Drug use: Never      Allergies   Patient has no known allergies.   Review of Systems Review of Systems  Cardiovascular:  Positive for chest pain.  Neurological:  Positive for dizziness.     Physical Exam Triage Vital Signs ED Triage Vitals  Encounter Vitals Group     BP 04/24/24 0825 (!) 142/86     Girls Systolic BP Percentile --      Girls Diastolic BP Percentile --      Boys Systolic BP Percentile --      Boys Diastolic BP Percentile --      Pulse Rate 04/24/24 0825 78     Resp 04/24/24 0825 16     Temp 04/24/24 0825 98.3 F (36.8 C)     Temp Source 04/24/24 0825 Oral     SpO2 04/24/24 0825 94 %     Weight --      Height --      Head Circumference --      Peak Flow --      Pain Score 04/24/24 0826 0     Pain Loc --      Pain Education --      Exclude from Growth Chart --    No data found.  Updated Vital Signs BP (!) 142/86 (BP Location: Left Arm)   Pulse 78   Temp 98.3 F (36.8 C) (Oral)   Resp 16   SpO2 94%   Visual Acuity Right Eye Distance:   Left Eye Distance:   Bilateral Distance:    Right Eye Near:   Left Eye Near:    Bilateral Near:     Physical Exam Vitals and nursing note reviewed.  Constitutional:      General: He is not in acute distress.    Appearance: He is obese. He is not ill-appearing, toxic-appearing or diaphoretic.  Eyes:     General: No scleral icterus. Cardiovascular:     Rate and Rhythm: Normal rate and regular rhythm.     Heart sounds: Normal heart sounds.  Pulmonary:     Effort: Pulmonary effort is normal. No respiratory distress.     Breath sounds: Normal breath sounds. No wheezing or rhonchi.  Chest:     Chest wall: No tenderness.  Skin:    General: Skin is warm.  Neurological:     Mental Status: He is alert and oriented to person, place, and time.  Psychiatric:        Mood and Affect: Mood normal.        Behavior: Behavior normal.      UC Treatments / Results  Labs (all labs ordered are listed, but only abnormal results  are displayed) Labs Reviewed - No data to display  EKG   Radiology No results found.  Procedures Procedures (  including critical care time)  Medications Ordered in UC Medications - No data to display  Initial Impression / Assessment and Plan / UC Course  I have reviewed the triage vital signs and the nursing notes.  Pertinent labs & imaging results that were available during my care of the patient were reviewed by me and considered in my medical decision making (see chart for details).     Patient advised to report to the ER for further evaluation. Final Clinical Impressions(s) / UC Diagnoses   Final diagnoses:  Chest pain, unspecified type     Discharge Instructions      Please report to ER for further evaluation of chest pain    ED Prescriptions   None    PDMP not reviewed this encounter.   Andra Corean BROCKS, PA-C 04/24/24 [phone]

## 2024-04-24 NOTE — ED Triage Notes (Signed)
 Patient was seen at [facility] and sent for evaluation of chest pain that started this morning feeling dizzy and having some pain in left arm and leg.  Hx of asthma.

## 2024-04-24 NOTE — ED Notes (Signed)
 Unable to obtain delta trop. Phlebotomist enroute to assist

## 2024-04-24 NOTE — ED Provider Notes (Signed)
 [place] EMERGENCY DEPARTMENT AT [facility] Provider Note   CSN: [id number] Arrival date & time: 04/24/24  9085     Patient presents with: Chest Pain   Adam Rasmussen is a 43 y.o. male.   43 year old male presents today for concern of chest pain that radiated to his left arm.  He initially went to urgent care and then was referred to the emergency room.  Currently the chest pain has resolved.  He also had a headache earlier and he took Excedrin.  Headache is resolved.  He does have history of migraine headaches.  Denies any associated shortness of breath, palpitations, lightheadedness.  Had 1 similar episode about a year ago which he states was attributed to anxiety.  He states he did see a cardiologist and had a stress test performed to get cleared for his work.  This was reassuring.  Wife is at bedside.  He has no other show no complaints.  The history is provided by the patient. No language interpreter was used.       Prior to Admission medications   Medication Sig Start Date End Date Taking? Authorizing Provider  cetirizine (ZYRTEC) 10 MG tablet Take 10 mg by mouth. 09/14/23 09/13/24  [provider]  fluticasone (FLONASE) 50 MCG/ACT nasal spray Place 1 spray into the nose. 09/14/23 09/13/24  [provider]  ibuprofen (ADVIL) 800 MG tablet Take 800 mg by mouth. 07/13/22   [provider]  methocarbamol (ROBAXIN) 500 MG tablet Take 500 mg by mouth. Patient not taking: Reported on 04/24/2024 07/21/22   [provider]  ondansetron  (ZOFRAN ) 4 MG tablet Take 1 tablet (4 mg total) by mouth every 8 (eight) hours as needed for up to 12 doses for nausea or vomiting. Patient not taking: Reported on 04/24/2024 10/15/22   Cottie Donnice PARAS, MD  varenicline  (CHANTIX  CONTINUING MONTH PAK) 1 MG tablet Take 1 tablet (1 mg total) by mouth 2 (two) times daily. Patient not taking: Reported on 04/24/2024 08/18/22   Parrett, Madelin RAMAN, NP  Varenicline  Tartrate,  Starter, (CHANTIX  STARTING MONTH PAK) 0.5 MG X 11 & 1 MG X 42 TBPK Day 1-3 Use 0.5mg  daily , Day 4-7 Use 0.5mg  Twice daily  , then 1 mg Twice daily Patient not taking: Reported on 04/24/2024 08/18/22   Parrett, Madelin RAMAN, NP    Allergies: Patient has no known allergies.    Review of Systems  Constitutional:  Negative for chills and fever.  Cardiovascular:  Positive for chest pain and leg swelling (Chronic).  All other systems reviewed and are negative.   Updated Vital Signs BP 125/77 (BP Location: Right Arm)   Pulse 65   Temp 98 F (36.7 C) (Oral)   Resp 17   Ht 6' (1.829 m)   Wt (!) 157.9 kg   SpO2 99%   BMI 47.20 kg/m   Physical Exam Vitals and nursing note reviewed.  Constitutional:      General: He is not in acute distress.    Appearance: Normal appearance. He is not ill-appearing.  HENT:     Head: Normocephalic and atraumatic.     Nose: Nose normal.  Eyes:     Conjunctiva/sclera: Conjunctivae normal.  Cardiovascular:     Rate and Rhythm: Normal rate and regular rhythm.  Pulmonary:     Effort: Pulmonary effort is normal. No respiratory distress.     Breath sounds: Normal breath sounds. No wheezing or rales.  Musculoskeletal:        General:  No deformity. Normal range of motion.     Cervical back: Normal range of motion.     Right lower leg: Edema (Trace pitting edema) present.     Left lower leg: Edema (Trace pitting edema) present.  Skin:    Findings: No rash.  Neurological:     General: No focal deficit present.     Mental Status: He is alert and oriented to person, place, and time. Mental status is at baseline.     Cranial Nerves: No cranial nerve deficit.     (all labs ordered are listed, but only abnormal results are displayed) Labs Reviewed  BASIC METABOLIC PANEL WITH GFR - Abnormal; Notable for the following components:      Result Value   Glucose, Bld 112 (*)    Calcium 8.5 (*)    All other components within normal limits  CBC  TROPONIN I (HIGH  SENSITIVITY)  TROPONIN I (HIGH SENSITIVITY)    EKG: None  Radiology: DG Chest 2 View Result Date: 04/24/2024 EXAM: 2 VIEW(S) XRAY OF THE CHEST 04/24/2024 09:45:00 AM COMPARISON: None available. CLINICAL HISTORY: chest pain FINDINGS: LUNGS AND PLEURA: No focal pulmonary opacity. No pulmonary edema. No pleural effusion. No pneumothorax. HEART AND MEDIASTINUM: No acute abnormality of the cardiac and mediastinal silhouettes. BONES AND SOFT TISSUES: No acute osseous abnormality. IMPRESSION: 1. No acute cardiopulmonary process. Electronically signed by: Helayne Hurst MD 04/24/2024 09:53 AM EST RP Workstation: [id number]     Procedures   Medications Ordered in the ED - No data to display                                  Medical Decision Making Amount and/or Complexity of Data Reviewed Labs: ordered. Radiology: ordered.   Medical Decision Making / ED Course   This patient presents to the ED for concern of chest pain, this involves an extensive number of treatment options, and is a complaint that carries with it a high risk of complications and morbidity.  The differential diagnosis includes ACS, PE, pneumonia, MSK etiology, GERD, dissection Suspicion for PE given low risk on Wells criteria.  Low suspicion for dissection given history and physical exam.  MDM: 43 year old male presents today for concern of chest pain.  Symptoms have resolved since coming into the emergency department.  Started after he woke up today and was getting ready for work.  CBC unremarkable, BMP without acute concern.  Troponin negative x 2.  Chest x-ray without acute cardiopulmonary process.  EKG without acute ischemic change. Low heart score.  Low suspicion for ACS.  Will have her follow-up with cardiology.  EKG did not crossover but image attached to the media tab.  Patient discharged in stable condition.  Return precaution discussed.  Patient voices understanding and is in agreement with  plan.   Additional history obtained: -Additional history obtained from wife, UC note -External records from outside source obtained and reviewed including: Chart review including previous notes, labs, imaging, consultation notes   Lab Tests: -I ordered, reviewed, and interpreted labs.   The pertinent results include:   Labs Reviewed  BASIC METABOLIC PANEL WITH GFR - Abnormal; Notable for the following components:      Result Value   Glucose, Bld 112 (*)    Calcium 8.5 (*)    All other components within normal limits  CBC  TROPONIN I (HIGH SENSITIVITY)  TROPONIN I (HIGH SENSITIVITY)  EKG  EKG Interpretation Date/Time:    Ventricular Rate:    PR Interval:    QRS Duration:    QT Interval:    QTC Calculation:   R Axis:      Text Interpretation:           Imaging Studies ordered: I ordered imaging studies including cxr I independently visualized and interpreted imaging. I agree with the radiologist interpretation   Medicines ordered and prescription drug management: No orders of the defined types were placed in this encounter.   -I have reviewed the patients home medicines and have made adjustments as needed   Reevaluation: After the interventions noted above, I reevaluated the patient and found that they have :improved  Co morbidities that complicate the patient evaluation No past medical history on file.    Dispostion: Discharged in stable condition.  Return precaution discussed.  Patient voices understanding and is in agreement with plan.      Final diagnoses:  Atypical chest pain    ED Discharge Orders          Ordered    Ambulatory referral to Cardiology       Comments: If you have not heard from the Cardiology office within the next 72 hours please call [phone].   04/24/24 1436               Hildegard Loge, PA-C 04/24/24 1447    Tonia Chew, MD 04/30/24 [phone]

## 2024-04-24 NOTE — Discharge Instructions (Signed)
 Please report to ER for further evaluation of chest pain

## 2024-04-24 NOTE — Discharge Instructions (Addendum)
 Please follow-up with your cardiologist.  Your workup in the emergency department was reassuring.  No evidence of heart attack right now.  If you are unable to get in touch with your cardiologist have listed our on-call cardiologist.  Return for any emergent symptoms. You had small amounts of swelling on both of your legs.  This could be dependent edema.  Try to elevate your legs if you are sitting for any significant amount of time.  At nighttime you can also prop your legs up with a couple pillows.

## 2024-05-01 DIAGNOSIS — Z419 Encounter for procedure for purposes other than remedying health state, unspecified: Secondary | ICD-10-CM | POA: Diagnosis not present

## 2024-06-25 ENCOUNTER — Telehealth: Payer: Self-pay | Admitting: Pulmonary Disease

## 2024-06-25 ENCOUNTER — Ambulatory Visit: Attending: Cardiovascular Disease | Admitting: Cardiovascular Disease

## 2024-06-25 ENCOUNTER — Encounter: Payer: Self-pay | Admitting: Cardiovascular Disease

## 2024-06-25 VITALS — BP 132/80 | HR 69 | Ht 72.0 in | Wt 348.8 lb

## 2024-06-25 DIAGNOSIS — Z720 Tobacco use: Secondary | ICD-10-CM | POA: Insufficient documentation

## 2024-06-25 DIAGNOSIS — Z7689 Persons encountering health services in other specified circumstances: Secondary | ICD-10-CM | POA: Insufficient documentation

## 2024-06-25 DIAGNOSIS — R079 Chest pain, unspecified: Secondary | ICD-10-CM | POA: Diagnosis present

## 2024-06-25 DIAGNOSIS — R0789 Other chest pain: Secondary | ICD-10-CM | POA: Insufficient documentation

## 2024-06-25 DIAGNOSIS — G4733 Obstructive sleep apnea (adult) (pediatric): Secondary | ICD-10-CM | POA: Insufficient documentation

## 2024-06-25 DIAGNOSIS — E785 Hyperlipidemia, unspecified: Secondary | ICD-10-CM | POA: Insufficient documentation

## 2024-06-25 DIAGNOSIS — E782 Mixed hyperlipidemia: Secondary | ICD-10-CM | POA: Diagnosis present

## 2024-06-25 NOTE — Progress Notes (Unsigned)
 "     06/25/2024 Adam Rasmussen   [birth year]  [id number]  [facility] Primary Cardiologist: Dorn JINNY Lesches MD [place]I [place], [place]  HPI:  Adam Rasmussen is a 44 y.o. male Morbidly overweight married African-American male father of 2 children who was referred by the emergency room for evaluation of atypical chest pain.  His risk factors include 10-pack-year history of back abuse currently smoking 1/2 pack/day.  He does have mild untreated hyperlipidemia.  There is no family's for heart disease.  He is never had a heart attack or stroke.  He does have obstructive sleep apnea on CPAP.  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  He was evaluated at [facility] emergency room.  His enzymes were negative.  EKG showed no acute changes.  He said no recurrent chest pain until today when he had some musculoskeletal chest pain.  At this point, I do not think his pain is ischemically mediated.  He has minimal risk factors.  No further workup necessary at this time.  Active Medications[1]   Allergies[2]  Social History   Socioeconomic History   Marital status: Married    Spouse name: Not on file   Number of children: Not on file   Years of education: Not on file   Highest education level: Not on file  Occupational History   Not on file  Tobacco Use   Smoking status: Every Day    Current packs/day: 0.00    Average packs/day: 0.5 packs/day    Types: Cigarettes    Last attempt to quit: 09/19/2022    Years since quitting: 1.7    Passive exposure: Current   Smokeless tobacco: Never   Tobacco comments:    2-3 cigarettes a week   Vaping Use   Vaping status: Never Used  Substance and Sexual Activity   Alcohol use: Yes   Drug use: Never   Sexual activity: Not  on file  Other Topics Concern   Not on file  Social History Narrative   Not on file   Social Drivers of Health   Tobacco Use: High Risk (06/25/2024)   Patient History    Smoking Tobacco Use: Every Day    Smokeless Tobacco Use: Never    Passive Exposure: Current  Financial Resource Strain: Low Risk (09/14/2023)   Received from [facility]   Overall Financial Resource Strain (CARDIA)    Difficulty of Paying Living Expenses: Not very hard  Food Insecurity: No Food Insecurity (09/14/2023)   Received from [facility]   Epic    Within the past 12 months, you worried that your food would run out before you got the money to buy more.: Never true    Within the past 12 months, the food you bought just didn't last and you didn't have money to get more.: Never true  Transportation Needs: No Transportation Needs (09/14/2023)   Received from [facility] - Transportation    Lack of Transportation (Non-Medical): No    Lack of Transportation (Medical): No  Physical Activity: Not on file  Stress: Not on file  Social Connections: Not on file  Intimate Partner Violence: Not on file  Depression (EYV7-0): Not on file  Alcohol Screen: Not on file  Housing: Low Risk (09/14/2023)  Received from [facility]   Epic    At any time in the past 12 months, were you homeless or living in a shelter (including now)?: No    In the past 12 months, how many times have you moved where you were living?: 0    In the last 12 months, was there a time when you were not able to pay the mortgage or rent on time?: No  Utilities: Not At Risk (09/14/2023)   Received from [facility] Utilities    Threatened with loss of utilities: No  Health Literacy: Not on file     Review of Systems: General: negative for chills, fever, night sweats or weight changes.  Cardiovascular: negative for chest pain, dyspnea on exertion, edema, orthopnea, palpitations, paroxysmal nocturnal dyspnea or shortness of  breath Dermatological: negative for rash Respiratory: negative for cough or wheezing Urologic: negative for hematuria Abdominal: negative for nausea, vomiting, diarrhea, bright red blood per rectum, melena, or hematemesis Neurologic: negative for visual changes, syncope, or dizziness All other systems reviewed and are otherwise negative except as noted above.    Blood pressure 132/80, pulse 69, height 6' (1.829 m), weight (!) 348 lb 12.8 oz (158.2 kg), SpO2 96%.  General appearance: alert and no distress Neck: no adenopathy, no carotid bruit, no JVD, supple, symmetrical, trachea midline, and thyroid  not enlarged, symmetric, no tenderness/mass/nodules Lungs: clear to auscultation bilaterally Heart: regular rate and rhythm, S1, S2 normal, no murmur, click, rub or gallop Extremities: extremities normal, atraumatic, no cyanosis or edema Pulses: 2+ and symmetric Skin: Skin color, texture, turgor normal. No rashes or lesions Neurologic: Grossly normal  EKG EKG Interpretation Date/Time:  Tuesday June 25 2024 13:42:15 EST Ventricular Rate:  66 PR Interval:  174 QRS Duration:  86 QT Interval:  374 QTC Calculation: 392 R Axis:   2  Text Interpretation: Normal sinus rhythm Normal ECG When compared with ECG of 24-Apr-2024 09:25, Criteria for Septal infarct are no longer Present Non-specific change in ST segment in Anterior leads Confirmed by Court Carrier [phone]) on 06/25/2024 1:43:48 PM    ASSESSMENT AND PLAN:   Morbid obesity (HCC) BMI 47  Tobacco abuse 10-pack-year history of tobacco abuse smoking 1/2 pack a day since last 20 years.  He has tried Chantix  but is unable to stop we talked about the importance of smoking cessation.  OSA (obstructive sleep apnea) On CPAP  Hyperlipidemia Mild hyperlipidemia with lipid profile performed a year ago revealing total cholesterol 172, LDL 113 and HDL 41.  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   I am going to provide him with a heart healthy diet.  At this point, I do not feel compelled to start him on a statin drug.  Atypical chest pain Patient has had 2 episodes of atypical chest pain, 1 in [place]  while on a long-distance truck trip and 1 here in town on 04/24/2024 while in his truck.  The pain was associated with left upper extremity radiation.  It lasted for hours at a time.  He was seen in the ER where his workup was unrevealing including negative serial enzymes.  He thinks this may be related to a panic attack.  He does not have ischemic qualities.  Only risk factor is tobacco abuse.  At this point, I do not think further workup is necessary.     Carrier DOROTHA Court MD FACP,FACC,FAHA, [facility] 06/25/2024 2:12 PM    [1]  Current Meds  Medication Sig   fluticasone (FLONASE) 50 MCG/ACT nasal spray Place 1 spray into the nose.   ibuprofen (ADVIL) 800 MG tablet Take 800 mg by mouth.  [2] No Known Allergies  "

## 2024-06-25 NOTE — Assessment & Plan Note (Signed)
 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  The pain was associated with left upper extremity radiation.  It lasted for hours at a time.  He was seen in the ER where his workup was unrevealing including negative serial enzymes.  He thinks this may be related to a panic attack.  He does not have ischemic qualities.  Only risk factor is tobacco abuse.  At this point, I do not think further workup is necessary.

## 2024-06-25 NOTE — Progress Notes (Unsigned)
 "     06/25/2024 Adam Rasmussen   [birth year]  [id number]  [facility] Primary Cardiologist: Dorn JINNY Lesches MD GENI SIX, [place], [place]  HPI:  Adam Rasmussen is a 44 y.o. 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  He basely has minimal cardiac risk factors.  He developed chest pain in [place]  while driving his truck and again in November locally.  He was evaluated 04/24/2024 in the ER with a negative workup.  He said no recurrent symptoms.   Active Medications[1]   Allergies[2]  Social History   Socioeconomic History   Marital status: Married    Spouse name: Not on file   Number of children: Not on file   Years of education: Not on file   Highest education level: Not on file  Occupational History   Not on file  Tobacco Use   Smoking status: Every Day    Current packs/day: 0.00    Average packs/day: 0.5 packs/day    Types: Cigarettes    Last attempt to quit: 09/19/2022    Years since quitting: 1.7    Passive exposure: Current   Smokeless tobacco: Never   Tobacco comments:    2-3 cigarettes a week   Vaping Use   Vaping status: Never Used  Substance and Sexual Activity   Alcohol use: Yes   Drug use: Never   Sexual activity: Not on file  Other Topics Concern   Not on file  Social History Narrative   Not on file   Social Drivers of Health   Tobacco Use: High Risk (06/25/2024)   Patient History    Smoking Tobacco Use: Every Day    Smokeless Tobacco Use: Never    Passive Exposure: Current  Financial Resource Strain: Low Risk (09/14/2023)   [facility] from [facility]   Overall Financial Resource Strain (CARDIA)    Difficulty of Paying Living Expenses: Not very hard  Food Insecurity: No Food Insecurity (09/14/2023)   Received from [facility]   Epic    Within the past 12 months, you worried that your food would run out before you got the  money to buy more.: Never true    Within the past 12 months, the food you bought just didn't last and you didn't have money to get more.: Never true  Transportation Needs: No Transportation Needs (09/14/2023)   Received from [facility] - Transportation    Lack of Transportation (Non-Medical): No    Lack of Transportation (Medical): No  Physical Activity: Not on file  Stress: Not on file  Social Connections: Not on file  Intimate Partner Violence: Not on file  Depression (EYV7-0): Not on file  Alcohol Screen: Not on file  Housing: Low Risk (09/14/2023)   Received from [facility]   Epic    At any time in the past 12 months, were you homeless or living in a shelter (including now)?: No    In the past 12 months, how many times have you moved where you were living?: 0    In the last 12 months, was there a time when you were not able to pay the mortgage or rent on time?: No  Utilities: Not At Risk (09/14/2023)   Received from [facility] Utilities    Threatened with loss of utilities: No  Health Literacy: Not on file     Review of  Systems: General: negative for chills, fever, night sweats or weight changes.  Cardiovascular: negative for chest pain, dyspnea on exertion, edema, orthopnea, palpitations, paroxysmal nocturnal dyspnea or shortness of breath Dermatological: negative for rash Respiratory: negative for cough or wheezing Urologic: negative for hematuria Abdominal: negative for nausea, vomiting, diarrhea, bright red blood per rectum, melena, or hematemesis Neurologic: negative for visual changes, syncope, or dizziness All other systems reviewed and are otherwise negative except as noted above.    Blood pressure 132/80, pulse 69, height 6' (1.829 m), weight (!) 348 lb 12.8 oz (158.2 kg), SpO2 96%.  General appearance: alert and no distress Neck: no adenopathy, no carotid bruit, no JVD, supple, symmetrical, trachea midline, and thyroid  not enlarged,  symmetric, no tenderness/mass/nodules Lungs: clear to auscultation bilaterally Heart: regular rate and rhythm, S1, S2 normal, no murmur, click, rub or gallop Extremities: extremities normal, atraumatic, no cyanosis or edema Pulses: 2+ and symmetric Skin: Skin color, texture, turgor normal. No rashes or lesions Neurologic: Grossly normal  EKG EKG Interpretation Date/Time:  Tuesday June 25 2024 13:42:15 EST Ventricular Rate:  66 PR Interval:  174 QRS Duration:  86 QT Interval:  374 QTC Calculation: 392 R Axis:   2  Text Interpretation: Normal sinus rhythm Normal ECG When compared with ECG of 24-Apr-2024 09:25, Criteria for Septal infarct are no longer Present Non-specific change in ST segment in Anterior leads Confirmed by Court Carrier [phone]) on 06/25/2024 1:43:48 PM    ASSESSMENT AND PLAN:   Morbid obesity (HCC) BMI 47  Tobacco abuse 10-pack-year history of tobacco abuse smoking 1/2 pack a day since last 20 years.  He has tried Chantix  but is unable to stop we talked about the importance of smoking cessation.  OSA (obstructive sleep apnea) On CPAP  Hyperlipidemia Mild hyperlipidemia with lipid profile performed a year ago revealing total cholesterol 172, LDL 113 and HDL 41.  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  I am going to provide him with a heart healthy diet.  At this point, I do not feel compelled to start him on a statin drug.  Atypical chest pain Patient has had 2 episodes of atypical chest pain, 1 in [place]  while on a long-distance truck trip and 1 here in town on 04/24/2024 while in his truck.  The pain was associated with left upper extremity radiation.  It lasted for hours at a time.  He was seen in the ER where his workup was unrevealing including negative serial enzymes.  He thinks this may be related to a panic attack.  He does not have ischemic qualities.  Only risk factor is tobacco abuse.  At this point, I do  not think further workup is necessary.     Carrier DOROTHA Court MD FACP,FACC,FAHA, FSCAI 06/25/2024 4:09 PM skews me    06/25/2024 Adam Rasmussen   [birth year]  [id number]  [facility] Primary Cardiologist: Carrier JINNY Court MD GENI SIX, [place], FSCAI  HPI:  Adam Rasmussen is a 44 y.o. male who is being seen today for the evaluation of *** at the request of Hildegard Loge, PA-C.   Left like he had a tightness and couldn't get a full breath. 2 episode, first in Nov. ED, thought panic attack. Second episode lasted a few hours and had CP with radiation to L arm and L leg. Also had associate dizziness and felt unsteady.   Current CP feels like muscle, sharp pain, hurts to take a  deep breath   Denies HTN, Diabetes   FH: no heart conditions   Active Medications[3]  No medications   Allergies[4]  Social History   Socioeconomic History   Marital status: Married    Spouse name: Not on file   Number of children: Not on file   Years of education: Not on file   Highest education level: Not on file  Occupational History   Not on file  Tobacco Use   Smoking status: Every Day    Current packs/day: 0.00    Average packs/day: 0.5 packs/day    Types: Cigarettes    Last attempt to quit: 09/19/2022    Years since quitting: 1.7    Passive exposure: Current   Smokeless tobacco: Never   Tobacco comments:    2-3 cigarettes a week   Vaping Use   Vaping status: Never Used  Substance and Sexual Activity   Alcohol use: Yes   Drug use: Never   Sexual activity: Not on file  Other Topics Concern   Not on file  Social History Narrative   Not on file   Social Drivers of Health   Tobacco Use: High Risk (06/25/2024)   Patient History    Smoking Tobacco Use: Every Day    Smokeless Tobacco Use: Never    Passive Exposure: Current  Financial Resource Strain: Low Risk (09/14/2023)   Received from [facility]   Overall Financial Resource Strain (CARDIA)     Difficulty of Paying Living Expenses: Not very hard  Food Insecurity: No Food Insecurity (09/14/2023)   Received from [facility]   Epic    Within the past 12 months, you worried that your food would run out before you got the money to buy more.: Never true    Within the past 12 months, the food you bought just didn't last and you didn't have money to get more.: Never true  Transportation Needs: No Transportation Needs (09/14/2023)   Received from [facility] - Transportation    Lack of Transportation (Non-Medical): No    Lack of Transportation (Medical): No  Physical Activity: Not on file  Stress: Not on file  Social Connections: Not on file  Intimate Partner Violence: Not on file  Depression (EYV7-0): Not on file  Alcohol Screen: Not on file  Housing: Low Risk (09/14/2023)   Received from [facility]   Epic    At any time in the past 12 months, were you homeless or living in a shelter (including now)?: No    In the past 12 months, how many times have you moved where you were living?: 0    In the last 12 months, was there a time when you were not able to pay the mortgage or rent on time?: No  Utilities: Not At Risk (09/14/2023)   Received from [facility] Utilities    Threatened with loss of utilities: No  Health Literacy: Not on file     Review of Systems: General: negative for chills, fever, night sweats or weight changes.  Cardiovascular: negative for chest pain, dyspnea on exertion, edema, orthopnea, palpitations, paroxysmal nocturnal dyspnea or shortness of breath Dermatological: negative for rash Respiratory: negative for cough or wheezing Urologic: negative for hematuria Abdominal: negative for nausea, vomiting, diarrhea, bright red blood per rectum, melena, or hematemesis Neurologic: negative for visual changes, syncope, or dizziness All other systems reviewed and are otherwise negative except as noted above.    Blood pressure 132/80, pulse  69, height 6' (1.829 m), weight (!) 348 lb 12.8 oz (158.2 kg), SpO2 96%.  General appearance: alert and no distress Neck: no adenopathy, no carotid bruit, no JVD, supple, symmetrical, trachea midline, and thyroid  not enlarged, symmetric, no tenderness/mass/nodules Lungs: clear to auscultation bilaterally Heart: regular rate and rhythm, S1, S2 normal, no murmur, click, rub or gallop Extremities: extremities normal, atraumatic, no cyanosis or edema Pulses: 2+ and symmetric Skin: Skin color, texture, turgor normal. No rashes or lesions Neurologic: Grossly normal  EKG not performed today  ASSESSMENT AND PLAN:   No problem-specific Assessment & Plan notes found for this encounter.     @JB @ 06/25/2024 1:47 PM      [1]  Current Meds  Medication Sig   fluticasone (FLONASE) 50 MCG/ACT nasal spray Place 1 spray into the nose.   ibuprofen (ADVIL) 800 MG tablet Take 800 mg by mouth.  [2] No Known Allergies [3]  Current Meds  Medication Sig   fluticasone (FLONASE) 50 MCG/ACT nasal spray Place 1 spray into the nose.   ibuprofen (ADVIL) 800 MG tablet Take 800 mg by mouth.  [4] No Known Allergies  "

## 2024-06-25 NOTE — Assessment & Plan Note (Signed)
 Mild hyperlipidemia with lipid profile performed a year ago revealing total cholesterol 172, LDL 113 and HDL 41.  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  I am going to provide him with a heart healthy diet.  At this point, I do not feel compelled to start him on a statin drug.

## 2024-06-25 NOTE — Patient Instructions (Signed)
 Medication Instructions:  Your physician recommends that you continue on your current medications as directed. Please refer to the Current Medication list given to you today.  *If you need a refill on your cardiac medications before your next appointment, please call your pharmacy*    Follow-Up: At [facility], you and your health needs are our priority.  As part of our continuing mission to provide you with exceptional heart care, our providers are all part of one team.  This team includes your primary Cardiologist (physician) and Advanced Practice Providers or APPs (Physician Assistants and Nurse Practitioners) who all work together to provide you with the care you need, when you need it.  Your next appointment:   We will see you on an as needed basis.    Provider:   Dorn Lesches, MD    We recommend signing up for the patient portal called MyChart.  Sign up information is provided on this After Visit Summary.  MyChart is used to connect with patients for Virtual Visits (Telemedicine).  Patients are able to view lab/test results, encounter notes, upcoming appointments, etc.  Non-urgent messages can be sent to your provider as well.   To learn more about what you can do with MyChart, go to [email or web address].   Other Instructions Heart-Healthy Eating Plan Eating a healthy diet is important for the health of your heart. A heart-healthy eating plan includes: Eating less unhealthy fats. Eating more healthy fats. Eating less salt in your food. Salt is also called sodium. Making other changes in your diet. Cooking Avoid frying your food. Try to bake, boil, grill, or broil it instead. You can also reduce fat by: Removing the skin from poultry. Removing all visible fats from meats. Steaming vegetables in water or broth. Meal planning  At meals, divide your plate into four equal parts: Fill one-half of your plate with vegetables and green salads. Fill one-fourth of  your plate with whole grains. Fill one-fourth of your plate with lean protein foods. Eat 2-4 cups of vegetables per day. One cup of vegetables is: 1 cup (91 g) broccoli or cauliflower florets. 2 medium carrots. 1 large bell pepper. 1 large sweet potato. 1 large tomato. 1 medium white potato. 2 cups (150 g) raw leafy greens. Eat 1-2 cups of fruit per day. One cup of fruit is: 1 small apple 1 large banana 1 cup (237 g) mixed fruit, 1 large orange,  cup (82 g) dried fruit, 1 cup (240 mL) 100% fruit juice. Eat more foods that have soluble fiber. These are apples, broccoli, carrots, beans, peas, and barley. Try to get 20-30 g of fiber per day. Eat 4-5 servings of nuts, legumes, and seeds per week: 1 serving of dried beans or legumes equals  cup (90 g) cooked. 1 serving of nuts is  oz (12 almonds, 24 pistachios, or 7 walnut halves). 1 serving of seeds equals  oz (8 g). General information Eat more home-cooked food. Eat less restaurant, buffet, and fast food. Limit or avoid alcohol. Limit foods that are high in starch and sugar. Avoid fried foods. Lose weight if you are overweight. Keep track of how much salt (sodium) you eat. This is important if you have high blood pressure. Ask your doctor to tell you more about this. Try to add vegetarian meals each week. Fats Choose healthy fats. These include olive oil and canola oil, flaxseeds, walnuts, almonds, and seeds. Eat more omega-3 fats. These include salmon, mackerel, sardines, tuna, flaxseed oil, and ground flaxseeds. Try  to eat fish at least 2 times each week. Check food labels. Avoid foods with trans fats or high amounts of saturated fat. Limit saturated fats. These are often found in animal products, such as meats, butter, and cream. These are also found in plant foods, such as palm oil, palm kernel oil, and coconut oil. Avoid foods with partially hydrogenated oils in them. These have trans fats. Examples are stick margarine,  some tub margarines, cookies, crackers, and other baked goods. What foods should I eat? Fruits All fresh, canned (in natural juice), or frozen fruits. Vegetables Fresh or frozen vegetables (raw, steamed, roasted, or grilled). Green salads. Grains Most grains. Choose whole wheat and whole grains most of the time. Rice and pasta, including brown rice and pastas made with whole wheat. Meats and other proteins Lean, well-trimmed beef, veal, pork, and lamb. Chicken and turkey without skin. All fish and shellfish. Wild duck, rabbit, pheasant, and venison. Egg whites or low-cholesterol egg substitutes. Dried beans, peas, lentils, and tofu. Seeds and most nuts. Dairy Low-fat or nonfat cheeses, including ricotta and mozzarella. Skim or 1% milk that is liquid, powdered, or evaporated. Buttermilk that is made with low-fat milk. Nonfat or low-fat yogurt. Fats and oils Non-hydrogenated (trans-free) margarines. Vegetable oils, including soybean, sesame, sunflower, olive, peanut, safflower, corn, canola, and cottonseed. Salad dressings or mayonnaise made with a vegetable oil. Beverages Mineral water. Coffee and tea. Diet carbonated beverages. Sweets and desserts Sherbet, gelatin, and fruit ice. Small amounts of dark chocolate. Limit all sweets and desserts. Seasonings and condiments All seasonings and condiments. The items listed above may not be a complete list of foods and drinks you can eat. Contact a dietitian for more options. What foods should I avoid? Fruits Canned fruit in heavy syrup. Fruit in cream or butter sauce. Fried fruit. Limit coconut. Vegetables Vegetables cooked in cheese, cream, or butter sauce. Fried vegetables. Grains Breads that are made with saturated or trans fats, oils, or whole milk. Croissants. Sweet rolls. Donuts. High-fat crackers, such as cheese crackers. Meats and other proteins Fatty meats, such as hot dogs, ribs, sausage, bacon, rib-eye roast or steak. High-fat deli  meats, such as salami and bologna. Caviar. Domestic duck and goose. Organ meats, such as liver. Dairy Cream, sour cream, cream cheese, and creamed cottage cheese. Whole-milk cheeses. Whole or 2% milk that is liquid, evaporated, or condensed. Whole buttermilk. Cream sauce or high-fat cheese sauce. Yogurt that is made from whole milk. Fats and oils Meat fat, or shortening. Cocoa butter, hydrogenated oils, palm oil, coconut oil, palm kernel oil. Solid fats and shortenings, including bacon fat, salt pork, lard, and butter. Nondairy cream substitutes. Salad dressings with cheese or sour cream. Beverages Regular sodas and juice drinks with added sugar. Sweets and desserts Frosting. Pudding. Cookies. Cakes. Pies. Milk chocolate or white chocolate. Buttered syrups. Full-fat ice cream or ice cream drinks. The items listed above may not be a complete list of foods and drinks to avoid. Contact a dietitian for more information. Summary Heart-healthy meal planning includes eating less unhealthy fats, eating more healthy fats, and making other changes in your diet. Eat a balanced diet. This includes fruits and vegetables, low-fat or nonfat dairy, lean protein, nuts and legumes, whole grains, and heart-healthy oils and fats. This information is not intended to replace advice given to you by your health care provider. Make sure you discuss any questions you have with your health care provider. Document Revised: 07/12/2021 Document Reviewed: 07/12/2021 Elsevier Patient Education  2024 [employer].

## 2024-06-25 NOTE — Assessment & Plan Note (Signed)
 10-pack-year history of tobacco abuse smoking 1/2 pack a day since last 20 years.  He has tried Chantix  but is unable to stop we talked about the importance of smoking cessation.

## 2024-06-25 NOTE — Telephone Encounter (Signed)
 Faxed 90 day compliance to [phone].  Received confirmation fax that it was sent successfully.   Nothing further needed.

## 2024-06-25 NOTE — Assessment & Plan Note (Signed)
BMI 47 

## 2024-06-25 NOTE — Telephone Encounter (Signed)
 Copied from CRM [id number]. Topic: Medical Record Request - Records Request >> Jun 25, 2024  3:53 PM Adam Rasmussen wrote: Reason for CRM: Patient [phone] needs the last 90 days cpap compliance report to be faxed to [facility] urgent care in [place] in [place] fax# [phone] and phone number # [phone], attention to Dr. Norleen Favorite. Patient is done with the appointment, and in order for the office to complete patient's department of transportation medical card medical clearance.

## 2024-06-25 NOTE — Assessment & Plan Note (Signed)
"  On CPAP         "

## 2024-06-28 ENCOUNTER — Ambulatory Visit: Payer: Self-pay | Admitting: Adult Health

## 2024-06-28 ENCOUNTER — Ambulatory Visit: Admitting: Adult Health

## 2024-06-28 ENCOUNTER — Encounter: Payer: Self-pay | Admitting: Adult Health

## 2024-06-28 VITALS — BP 124/82 | HR 72 | Temp 98.8°F | Ht 72.0 in | Wt 356.8 lb

## 2024-06-28 DIAGNOSIS — G4733 Obstructive sleep apnea (adult) (pediatric): Secondary | ICD-10-CM

## 2024-06-28 DIAGNOSIS — Z5181 Encounter for therapeutic drug level monitoring: Secondary | ICD-10-CM

## 2024-06-28 DIAGNOSIS — Z6841 Body Mass Index (BMI) 40.0 and over, adult: Secondary | ICD-10-CM

## 2024-06-28 DIAGNOSIS — F17210 Nicotine dependence, cigarettes, uncomplicated: Secondary | ICD-10-CM | POA: Diagnosis not present

## 2024-06-28 LAB — TSH: TSH: 1.85 u[IU]/mL (ref 0.35–5.50)

## 2024-06-28 LAB — COMPREHENSIVE METABOLIC PANEL WITH GFR
ALT: 27 U/L (ref 3–53)
AST: 17 U/L (ref 5–37)
Albumin: 4.3 g/dL (ref 3.5–5.2)
Alkaline Phosphatase: 71 U/L (ref 39–117)
BUN: 10 mg/dL (ref 6–23)
CO2: 28 meq/L (ref 19–32)
Calcium: 9.1 mg/dL (ref 8.4–10.5)
Chloride: 106 meq/L (ref 96–112)
Creatinine, Ser: 1.08 mg/dL (ref 0.40–1.50)
GFR: 84.84 mL/min
Glucose, Bld: 113 mg/dL — ABNORMAL HIGH (ref 70–99)
Potassium: 4.9 meq/L (ref 3.5–5.1)
Sodium: 141 meq/L (ref 135–145)
Total Bilirubin: 0.3 mg/dL (ref 0.2–1.2)
Total Protein: 6.8 g/dL (ref 6.0–8.3)

## 2024-06-28 LAB — LIPID PANEL
Cholesterol: 167 mg/dL (ref 28–200)
HDL: 39.6 mg/dL
LDL Cholesterol: 115 mg/dL — ABNORMAL HIGH (ref 10–99)
NonHDL: 127.71
Total CHOL/HDL Ratio: 4
Triglycerides: 63 mg/dL (ref 10.0–149.0)
VLDL: 12.6 mg/dL (ref 0.0–40.0)

## 2024-06-28 MED ORDER — ZEPBOUND 2.5 MG/0.5ML ~~LOC~~ SOAJ: 2.5000 mg | SUBCUTANEOUS | 0 refills | Status: DC

## 2024-06-28 NOTE — Progress Notes (Signed)
 "  @Patient  ID: Adam Rasmussen, male    DOB: 17-Jul-1980, 44 y.o.   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     06/28/2024 Follow up ; OSA Patient returns for a follow-up visit for sleep apnea.  Last seen April 2024.  Patient says he is doing well on CPAP.  Wears a CPAP every single night.  CPAP download shows excellent compliance.  Daily average usage at 7.5 hours.  Patient is on auto CPAP 10 to 15 cm H2O.  Daily average pressure at 12 cm H2O.  AHI 0.4/hour.  Patient gets his supplies online.  DME company for his machine is at the care.  Machine was obtained in 2023.  Patient feels he benefits from CPAP.  Patient says he continues to struggle with weight loss.  Has tried many things including portion control and activity but has been unsuccessful.  Current weight is at 356 pounds with a BMI of 48.  Previous sleep study showed moderate sleep apnea in August 2023.    Allergies[1]  Immunization History  Administered Date(s) Administered   Moderna Sars-Covid-2 Vaccination 04/25/2020, 05/23/2020   Tdap 12/13/2012    History reviewed. No pertinent past medical history.  Tobacco History: Tobacco Use History[2] Ready to quit: Not Answered Counseling given: Not Answered Tobacco comments: Smokes last cigarette 06/23/24, does zyns instead   Outpatient Medications Prior to Visit  Medication Sig Dispense Refill   fluticasone (FLONASE) 50 MCG/ACT nasal spray Place 1 spray into the nose. (Patient not taking: Reported on 06/28/2024)     ibuprofen (ADVIL) 800 MG tablet Take 800 mg by mouth. (Patient not taking: Reported on 06/28/2024)     No facility-administered medications prior to visit.     Review of  Systems:   Constitutional:   No  weight loss, night sweats,  Fevers, chills, +fatigue, or  lassitude.  HEENT:   No headaches,  Difficulty swallowing,  Tooth/dental problems, or  Sore throat,                No sneezing, itching, ear ache, nasal congestion, post nasal drip,   CV:  No chest pain,  Orthopnea, PND, swelling in lower extremities, anasarca, dizziness, palpitations, syncope.   GI  No heartburn, indigestion, abdominal pain, nausea, vomiting, diarrhea, change in bowel habits, loss of appetite, bloody stools.   Resp: No shortness of breath with exertion or at rest.  No excess mucus, no productive cough,  No non-productive cough,  No coughing up of blood.  No change in color of mucus.  No wheezing.  No chest wall deformity  Skin: no rash or lesions.  GU: no dysuria, change in color of urine, no urgency or frequency.  No flank pain, no hematuria   MS:  No joint pain or swelling.  No decreased range of motion.  No back pain.    Physical Exam  BP 124/82   Pulse 72   Temp 98.8 F (37.1 C)   Ht 6' (1.829 m) Comment: Per pt  Wt (!) 356 lb 12.8 oz (161.8 kg)   SpO2 94% Comment: RA  BMI 48.39 kg/m   GEN: A/Ox3; pleasant , NAD, well nourished  HEENT:  [place]/AT,   NOSE-clear, THROAT-clear, no lesions, no postnasal drip or exudate noted.  Class III-IV MP airway  NECK:  Supple w/ fair ROM; no JVD; normal carotid impulses w/o bruits; no thyromegaly or nodules palpated; no lymphadenopathy.    RESP  Clear  P & A; w/o, wheezes/ rales/ or rhonchi. no accessory muscle use, no dullness to percussion  CARD:  RRR, no m/r/g, no peripheral edema, pulses intact, no cyanosis or clubbing.  GI:   Soft & nt; nml bowel sounds; no organomegaly or masses detected.   Musco: Warm bil, no deformities or joint swelling noted.   Neuro: alert, no focal deficits noted.    Skin: Warm, no lesions or rashes    Lab Results:Reviewed 06/28/2024   CBC    Component Value Date/Time   WBC 9.4 04/24/2024  0929   RBC 4.61 04/24/2024 0929   HGB 13.0 04/24/2024 0929   HCT 40.4 04/24/2024 0929   PLT 281 04/24/2024 0929   MCV 87.6 04/24/2024 0929   MCH 28.2 04/24/2024 0929   MCHC 32.2 04/24/2024 0929   RDW 12.8 04/24/2024 0929   LYMPHSABS 2.4 07/13/2022 2155   MONOABS 0.6 07/13/2022 2155   EOSABS 0.3 07/13/2022 2155   BASOSABS 0.0 07/13/2022 2155    BMET    Component Value Date/Time   NA 138 04/24/2024 0929   K 4.3 04/24/2024 0929   CL 109 04/24/2024 0929   CO2 22 04/24/2024 0929   GLUCOSE 112 (H) 04/24/2024 0929   BUN 12 04/24/2024 0929   CREATININE 1.17 04/24/2024 0929   CALCIUM 8.5 (L) 04/24/2024 0929   GFRNONAA >60 04/24/2024 0929    BNP No results found for: BNP  ProBNP No results found for: PROBNP  Imaging: No results found.  Administration History     None           No data to display          No results found for: NITRICOXIDE      No data to display              Assessment & Plan:   Assessment and Plan 1 OSA-excellent control and compliance on nocturnal CPAP-continue on current settings.  CPAP care discussed in detail.  We discussed ongoing treatment with weight loss as his BMI is at 48. Patient has moderate  sleep apnea related to obesity with BMI >/30 posing significant cardiovascular risks. Patient has failed traditional weight loss measures with caloric deficit and consistent exercise of 150 min/week for >/6 months. Patient will be initiated on Zepbound  (tirzepatide ) for weight management. Zepbound  is the only pharmaceutical treatment approved for moderate-to-severe OSA in adults who are overweight (BMI >/27) or obese (BMI >/30). The patient will continue lifestyle modifications, including structured nutrition and physical activity as directed. No other GLP1 therapy will be used simultaneously at this time. The patient does not have any FDA labeled contraindications to this agent, including pregnancy, lactation, hx or family history of  medullary thyroid  cancer, or multiple endocrine neoplasia type II. Side effect profile has been reviewed with patient. Aware of red flag symptoms to notify of immediately or seek emergency care, including severe nausea/vomiting, inability to pass bowels or gas, severe abdominal pain/tenderness, jaundice.    2.  Morbid obesity with BMI of 48.-Discussed healthy weight management. Begin Zepbound  2.5mg  weekly. Injector reviewed with patient.  Labs today with TSH and Lipid panel. CMET   3. Tobacco abuse: discussed cessation.  Plan   Continue on CPAP  At bedtime, wear all night long , for at least 6hr each night  Saline nasal spray Twice daily   Saline nasal gel At bedtime   Work on healthy weight loss Do not drive if sleepy Work on not smoking .  1800QuitNow  Set quit date  Begin Zepbound  2.5mg  injection weekly.  Labs today Follow-up in 1 months with Dr. Neda or Aryaa Bunting NP  and As needed       Madelin Stank, NP 06/28/2024        [1] No Known Allergies [2]  Social History Tobacco Use  Smoking Status Every Day   Current packs/day: 0.00   Average packs/day: 0.5 packs/day   Types: Cigarettes   Last attempt to quit: 09/19/2022   Years since quitting: 1.7   Passive exposure: Current  Smokeless Tobacco Never  Tobacco Comments   Smokes last cigarette 06/23/24, does zyns instead   "

## 2024-06-28 NOTE — Patient Instructions (Addendum)
 Continue on CPAP At bedtime, wear all night long , for at least 6hr each night  Saline nasal spray Twice daily   Saline nasal gel At bedtime   Work on healthy weight loss Do not drive if sleepy Work on not smoking .  1800QuitNow  Set quit date  Begin Zepbound  2.5mg  injection weekly.  Labs today Follow-up in 1 months with Dr. Neda or Bransen Fassnacht NP  and As needed     Notify your provider if you are planning to have a procedure/surgery, as this medication will need to be stopped prior.

## 2024-07-03 ENCOUNTER — Telehealth: Payer: Self-pay

## 2024-07-03 NOTE — Telephone Encounter (Signed)
 Pharmacy Patient Advocate Encounter   Received notification from Fax that prior authorization for Zepbound  2.5mg  pen is required/requested.   Insurance verification completed.   The patient is insured through [facility] MEDICAID.   Pharmacy Patient Advocate Encounter  Per new criteria, patient must try and fail a 3-6 month trial of preferred medication [facility]) prior to authorization for Zepbound .   [place]egovy will also need a PA.   Key:  [id number]

## 2024-07-03 NOTE — Telephone Encounter (Signed)
Pulm

## 2024-07-11 MED ORDER — WEGOVY 0.25 MG/0.5ML ~~LOC~~ SOAJ: 0.2500 mg | SUBCUTANEOUS | 0 refills | Status: AC

## 2024-07-11 NOTE — Telephone Encounter (Signed)
 Please call and let patient know the insurance requirements  Begin Wegovy  starting dose 0.25mg  weekly injections . IN place of Zepbound  (approved Rx for OSA/Obesity)

## 2024-07-11 NOTE — Addendum Note (Signed)
 Addended by: ORLIE MADELIN RAMAN on: 07/11/2024 05:52 PM   Modules accepted: Orders

## 2024-07-12 NOTE — Telephone Encounter (Signed)
 Copied from CRM [id number]. Topic: Clinical - Prescription Issue >> Jul 12, 2024 12:56 PM Rilla B wrote: Reason for CRM: Patient went to pharmacy to pick up prescription. Patient calling to check on the status of his prescription for Zepbound .  Patient asking if prescription has been sent so he can pick it up.  Please call patient @ [phone]   ----------------------------------------------------------------------- From previous Reason for Contact - Medication Question: Reason for CRM:  Patient calling to check on the status of his prescription for Zepbound .  Patient asking if prescription has been sent so he can pick it up.  Please call patient @ [phone]   Called and spoke to pt. Advised of Tammy's message. Pt verbalized understanding, NFN.

## 2024-07-29 ENCOUNTER — Ambulatory Visit: Admitting: Pulmonary Disease
